# Patient Record
Sex: Female | Born: 1965 | Race: White | Hispanic: No | Marital: Married | Smoking: Former smoker
Health system: Southern US, Community
[De-identification: ages and names within clinical notes are randomized; demographics above are authoritative.]

## PROBLEM LIST (undated history)

## (undated) ENCOUNTER — Emergency Department: Admission: EM | Discharge status: Absent without leave | Payer: Self-pay

## (undated) DIAGNOSIS — E079 Disorder of thyroid, unspecified: Secondary | ICD-10-CM

## (undated) DIAGNOSIS — K222 Esophageal obstruction: Secondary | ICD-10-CM

## (undated) HISTORY — PX: ABDOMINAL HYSTERECTOMY: SHX81

## (undated) HISTORY — PX: LITHOTRIPSY: SUR834

## (undated) HISTORY — PX: APPENDECTOMY: SHX54

---

## 2008-04-13 ENCOUNTER — Encounter: Admission: RE | Admit: 2008-04-13 | Discharge: 2008-04-13 | Payer: Self-pay | Admitting: Unknown Physician Specialty

## 2008-04-14 ENCOUNTER — Encounter: Admission: RE | Admit: 2008-04-14 | Discharge: 2008-04-14 | Payer: Self-pay | Admitting: Unknown Physician Specialty

## 2008-08-29 ENCOUNTER — Encounter: Admission: RE | Admit: 2008-08-29 | Discharge: 2008-08-29 | Payer: Self-pay | Admitting: Obstetrics and Gynecology

## 2013-06-21 ENCOUNTER — Emergency Department
Admission: EM | Admit: 2013-06-21 | Discharge: 2013-06-21 | Disposition: A | Discharge status: Home / Self Care | Payer: BC Managed Care – PPO | Source: Home / Self Care | Attending: Emergency Medicine | Admitting: Emergency Medicine

## 2013-06-21 ENCOUNTER — Emergency Department (INDEPENDENT_AMBULATORY_CARE_PROVIDER_SITE_OTHER): Payer: BC Managed Care – PPO

## 2013-06-21 ENCOUNTER — Encounter: Payer: Self-pay | Admitting: Emergency Medicine

## 2013-06-21 DIAGNOSIS — R509 Fever, unspecified: Secondary | ICD-10-CM

## 2013-06-21 DIAGNOSIS — R05 Cough: Secondary | ICD-10-CM

## 2013-06-21 DIAGNOSIS — T7840XA Allergy, unspecified, initial encounter: Secondary | ICD-10-CM

## 2013-06-21 DIAGNOSIS — R062 Wheezing: Secondary | ICD-10-CM

## 2013-06-21 DIAGNOSIS — J209 Acute bronchitis, unspecified: Secondary | ICD-10-CM

## 2013-06-21 DIAGNOSIS — R059 Cough, unspecified: Secondary | ICD-10-CM

## 2013-06-21 HISTORY — DX: Disorder of thyroid, unspecified: E07.9

## 2013-06-21 MED ORDER — ALBUTEROL SULFATE (2.5 MG/3ML) 0.083% IN NEBU: 2.5000 mg | INHALATION_SOLUTION | RESPIRATORY_TRACT | Status: DC | PRN

## 2013-06-21 MED ORDER — AZITHROMYCIN 250 MG PO TABS: ORAL_TABLET | ORAL | Status: DC

## 2013-06-21 MED ORDER — METHYLPREDNISOLONE SODIUM SUCC 125 MG IJ SOLR
125.0000 mg | INTRAMUSCULAR | Status: AC
  Administered 2013-06-21: 125 mg via INTRAMUSCULAR

## 2013-06-21 MED ORDER — PREDNISONE 20 MG PO TABS: ORAL_TABLET | ORAL | Status: DC

## 2013-06-21 MED ORDER — IPRATROPIUM-ALBUTEROL 0.5-2.5 (3) MG/3ML IN SOLN
3.0000 mL | Freq: Once | RESPIRATORY_TRACT | Status: AC
Start: 2013-06-21 — End: 2013-06-21
  Administered 2013-06-21: 3 mL via RESPIRATORY_TRACT

## 2013-06-21 MED ORDER — PROMETHAZINE-DM 6.25-15 MG/5ML PO SYRP: ORAL_SOLUTION | ORAL | Status: DC

## 2013-06-21 NOTE — ED Notes (Signed)
Aja c/o productive cough that started in [place] on 06/04/13. She started taking Malarone on 06/01/13. Stopped Malarone on 2//15 She reports the air quality was poor in her area, very smoky due to volcanos and cooking with wood. She had a mild rash on her chest that lasted 2 days. Her cough is now dry without fever. SOB with exertion. No hx of lung disease.

## 2013-06-21 NOTE — ED Provider Notes (Addendum)
CSN: [id number]     Arrival date & time 06/21/13  4098 History   First MD Initiated Contact with Patient 06/21/13 1005     Chief Complaint  Patient presents with  . Cough     HPI Linda Drake c/o productive cough that started in [place] (on mission trip) on 06/04/13, after exposure to breathing [employer] there. She started taking Malarone (a week before leaving for [place]) on 05/28/13. Stopped Malarone on 06/18/13, about 4 days after returning to [place].  She reports the air quality was poor in her area there, very smoky due to volcanos and cooking with wood.  She had a mild itchy rash on her chest and legs while in [place] that lasted 2 days.--denies facial or lip swelling or dysphasia.   Her cough is now dry without fever. + Dyspnea, mild wheeze with exertion. No hx of lung disease.  She quit smoking 2005. She recalls a bout of bronchitis and wheezing in 2005 that resolved without sequelae. She may have had a low-grade fever for a few days, but denies high fever, chills or or sweats or rigors. Currently, denies any fever.  URI HISTORY  Timmy is a 48 y.o. female who complains of above symptoms. Has not been using any  over-the-counter treatment.  No chills/sweats + Low-grade Fever  No Nasal congestion No Discolored Post-nasal drainage No sinus pain/pressure No sore throat  +  Frequent, nonproductive cough Several days ago, had some cough productive of yellow-green sputum. Positive wheezing Positive chest congestion No hemoptysis History of mild shortness of breath with exertion No pleuritic pain  No itchy/red eyes No earache  No nausea No vomiting No abdominal pain No diarrhea  A week ago, had slightly pruritic rash anterior chest, lasted 2 days, then resolved.  +  Fatigue No myalgias. No arthralgias or joint swelling or joint symptoms  No headache   Past Medical History  Diagnosis Date  . Thyroid disease     radioactive iodine   Past Surgical History   Procedure Laterality Date  . Appendectomy    . Cesarean section     Family History  Problem Relation Age of Onset  . Hypertension Mother    History  Substance Use Topics  . Smoking status: Former Smoker -- 15 years    Types: Cigarettes    Quit date: 06/22/2003  . Smokeless tobacco: Never Used  . Alcohol Use: Yes   OB History   Grav Para Term Preterm Abortions TAB SAB Ect Mult Living                 Review of Systems  Constitutional: Positive for fatigue (mild). Negative for chills.  HENT: Negative for congestion, ear pain, facial swelling, rhinorrhea, sinus pressure, sneezing, sore throat and trouble swallowing.        Mild hoarseness at times  Respiratory: Positive for wheezing.   Cardiovascular: Negative for chest pain, palpitations and leg swelling.  Gastrointestinal: Negative.   Musculoskeletal: Negative for arthralgias and joint swelling.  Neurological: Negative for syncope.  All other systems reviewed and are negative.    LMP 05/31/2013  Allergies  Morphine and related  Home Medications   Current Outpatient Rx  Name  Route  Sig  Dispense  Refill  . levothyroxine (SYNTHROID, LEVOTHROID) 88 MCG tablet   Oral   Take 88 mcg by mouth daily before breakfast.         . albuterol (PROVENTIL) (2.5 MG/3ML) 0.083% nebulizer solution   Nebulization   Take 3  mLs (2.5 mg total) by nebulization every 4 (four) hours as needed for wheezing.   75 mL   0   . azithromycin (ZITHROMAX Z-PAK) 250 MG tablet      Take 2 tablets on day one, then 1 tablet daily on days 2 through 5   1 each   0   . predniSONE (DELTASONE) 20 MG tablet      Take 3 tablets daily with breakfast x3 days   9 tablet   0   . promethazine-dextromethorphan (PROMETHAZINE-DM) 6.25-15 MG/5ML syrup      5 ml po every 4-6 hours as needed for cough   118 mL   0    BP 117/85  Pulse 99  Temp(Src) 98.2 F (36.8 C) (Oral)  Resp 16  Ht 5\' 6"  (1.676 m)  Wt 140 lb (63.504 kg)  BMI 22.61 kg/m2   SpO2 100%  LMP 05/31/2013 Physical Exam  Nursing note and vitals reviewed. Constitutional: She is oriented to person, place, and time. She appears well-developed and well-nourished. No distress.  HENT:  Head: Normocephalic and atraumatic.  Right Ear: Tympanic membrane normal.  Left Ear: Tympanic membrane normal.  Nose: Nose normal.  Mouth/Throat: Oropharynx is clear and moist. No oropharyngeal exudate.  Eyes: Right eye exhibits no discharge. Left eye exhibits no discharge. No scleral icterus.  Neck: Neck supple.  Cardiovascular: Normal rate, regular rhythm and normal heart sounds.   Pulmonary/Chest: No respiratory distress. She has wheezes. She has rhonchi. She has no rales.  Lymphadenopathy:    She has no cervical adenopathy.  Neurological: She is alert and oriented to person, place, and time.  Skin: Skin is warm and dry.  Minimal, faint, slightly red, blanching rash, upper anterior chest. No red streaks.  No other rash noted    hacking cough noted.  ED Course  Procedures (including critical care time) Labs Review Labs Reviewed - No data to display Imaging Review Dg Chest 2 View  06/21/2013   CLINICAL DATA:  Cough and wheezing ; fever  EXAM: CHEST  2 VIEW  COMPARISON:  None.  FINDINGS: The lungs are clear. The heart size and pulmonary vascularity are normal. No adenopathy. No bone lesions.  IMPRESSION: No abnormality noted.   Electronically Signed   By: Bretta BangWilliam  Woodruff M.D.   On: 06/21/2013 10:47      MDM   Final diagnoses:  Bronchitis, acute, with bronchospasm  Allergic reaction    Most likely, has acute bronchitis with bronchospasm. Was likely triggered by poor air quality in [place].-- Although possible, less likely related to allergic reaction from the Malarone.  Treatment options discussed, as well as risks, benefits, alternatives. Patient voiced understanding and agreement with the following plans: DuoNeb nebulizer treatment given. Wheezing  improved. Solumedrol 125 mg IM stat Prednisone burst, 60 mg by mouth daily times three days Z Pak for bacterial coverage As nebulizer treatment was so effective in urgent care today,Albuterol solution nebules prescribed for home nebulizer, to use prn, Rx'd at her request. Promethazine DM cough syrup, use PRN cough.  Follow-up with your primary care doctor in 5-7 days if not improving, or sooner if symptoms become worse. Precautions discussed. Red flags discussed. Questions invited and answered. Patient voiced understanding and agreement.         Lajean Manesavid Massey, MD 06/22/13 1203  Lajean Manesavid Massey, MD 06/22/13 [phone]

## 2015-03-10 ENCOUNTER — Encounter: Payer: Self-pay | Admitting: Emergency Medicine

## 2015-03-10 ENCOUNTER — Emergency Department
Admission: EM | Admit: 2015-03-10 | Discharge: 2015-03-10 | Disposition: A | Discharge status: Home / Self Care | Payer: BLUE CROSS/BLUE SHIELD | Source: Home / Self Care | Attending: Family Medicine | Admitting: Family Medicine

## 2015-03-10 DIAGNOSIS — J0100 Acute maxillary sinusitis, unspecified: Secondary | ICD-10-CM | POA: Diagnosis not present

## 2015-03-10 DIAGNOSIS — J069 Acute upper respiratory infection, unspecified: Secondary | ICD-10-CM

## 2015-03-10 MED ORDER — SALINE SPRAY 0.65 % NA SOLN: 1.0000 | NASAL | Status: DC | PRN

## 2015-03-10 MED ORDER — AMOXICILLIN-POT CLAVULANATE 875-125 MG PO TABS: 1.0000 | ORAL_TABLET | Freq: Two times a day (BID) | ORAL | Status: DC

## 2015-03-10 MED ORDER — BENZONATATE 100 MG PO CAPS: 100.0000 mg | ORAL_CAPSULE | Freq: Three times a day (TID) | ORAL | Status: DC

## 2015-03-10 NOTE — ED Provider Notes (Signed)
CSN: [id number]     Arrival date & time 03/10/15  0919 History   First MD Initiated Contact with Patient 03/10/15 0920     Chief Complaint  Patient presents with  . Facial Pain  . Nasal Congestion  . Cough  . Headache  . Hoarse  . Fatigue   (Consider location/radiation/quality/duration/timing/severity/associated sxs/prior Treatment) HPI Pt is a 49yo female presenting to [facility] with c/o sudden onset, suddenly worsening sore throat, hoarse voice, nasal congestion with facial pain, frontal headache, fatigue, subjective fever and body aches that started 3 days ago.  Pt states symptoms started with severe sore throat that has started to improve but her facial pain is most concerning for patient.  She notes a family member has been in the hospital so she has been visiting him frequently but no sick contacts at home or recent travel. Denies n/v/d. She has been taking Advil with minimal relief.  She had her flu vaccine in late September of this year.  Past Medical History  Diagnosis Date  . Thyroid disease     radioactive iodine   Past Surgical History  Procedure Laterality Date  . Appendectomy    . Cesarean section     Family History  Problem Relation Age of Onset  . Hypertension Mother    Social History  Substance Use Topics  . Smoking status: Former Smoker -- 15 years    Types: Cigarettes    Quit date: 06/22/2003  . Smokeless tobacco: Never Used  . Alcohol Use: Yes   OB History    No data available     Review of Systems  Constitutional: Positive for fever, chills and fatigue.  HENT: Positive for congestion, rhinorrhea, sinus pressure, sneezing, sore throat and voice change. Negative for ear pain and trouble swallowing.   Respiratory: Positive for cough. Negative for chest tightness and shortness of breath.   Cardiovascular: Negative for chest pain and palpitations.  Gastrointestinal: Negative for nausea, vomiting, abdominal pain and diarrhea.  Musculoskeletal: Negative for  myalgias, back pain and arthralgias.  Skin: Negative for rash.  Neurological: Positive for headaches. Negative for dizziness and light-headedness.    Allergies  Morphine and related  Home Medications   Prior to Admission medications   Medication Sig Start Date End Date Taking? Authorizing Provider  albuterol (PROVENTIL) (2.5 MG/3ML) 0.083% nebulizer solution Take 3 mLs (2.5 mg total) by nebulization every 4 (four) hours as needed for wheezing. 06/21/13   Lajean Manes, MD  amoxicillin-clavulanate (AUGMENTIN) 875-125 MG tablet Take 1 tablet by mouth 2 (two) times daily. One po bid x 7 days 03/10/15   Junius Finner, PA-C  azithromycin (ZITHROMAX Z-PAK) 250 MG tablet Take 2 tablets on day one, then 1 tablet daily on days 2 through 5 06/21/13   Lajean Manes, MD  benzonatate (TESSALON) 100 MG capsule Take 1 capsule (100 mg total) by mouth every 8 (eight) hours. 03/10/15   Junius Finner, PA-C  levothyroxine (SYNTHROID, LEVOTHROID) 88 MCG tablet Take 88 mcg by mouth daily before breakfast.    Historical Provider, MD  predniSONE (DELTASONE) 20 MG tablet Take 3 tablets daily with breakfast x3 days 06/21/13   Lajean Manes, MD  promethazine-dextromethorphan (PROMETHAZINE-DM) 6.25-15 MG/5ML syrup 5 ml po every 4-6 hours as needed for cough 06/21/13   Lajean Manes, MD  sodium chloride (OCEAN) 0.65 % SOLN nasal spray Place 1 spray into both nostrils as needed. 03/10/15   Junius Finner, PA-C   Meds Ordered and Administered this Visit  Medications - No data  to display  BP 128/83 mmHg  Pulse 85  Temp(Src) 98.2 F (36.8 C) (Oral)  Resp 16  Ht 5\' 7"  (1.702 m)  Wt 155 lb (70.308 kg)  BMI 24.27 kg/m2  SpO2 99%  LMP 03/01/2015 No data found.   Physical Exam  Constitutional: She appears well-developed and well-nourished. No distress.  HENT:  Head: Normocephalic and atraumatic.  Right Ear: Hearing, tympanic membrane, external ear and ear canal normal.  Left Ear: Hearing, tympanic membrane, external ear  and ear canal normal.  Nose: Mucosal edema present. Right sinus exhibits maxillary sinus tenderness and frontal sinus tenderness. Left sinus exhibits maxillary sinus tenderness and frontal sinus tenderness.  Mouth/Throat: Uvula is midline and mucous membranes are normal. Posterior oropharyngeal erythema present. No oropharyngeal exudate, posterior oropharyngeal edema or tonsillar abscesses.  Eyes: Conjunctivae and EOM are normal. Pupils are equal, round, and reactive to light. No scleral icterus.  Neck: Normal range of motion. Neck supple.  Cardiovascular: Normal rate, regular rhythm and normal heart sounds.   Pulmonary/Chest: Effort normal and breath sounds normal. No respiratory distress. She has no wheezes. She has no rales. She exhibits no tenderness.  Abdominal: Soft. She exhibits no distension and no mass. There is no tenderness. There is no rebound and no guarding.  Musculoskeletal: Normal range of motion.  Lymphadenopathy:    She has no cervical adenopathy.  Neurological: She is alert.  Skin: Skin is warm and dry. She is not diaphoretic.  Nursing note and vitals reviewed.   ED Course  Procedures (including critical care time)  Labs Review Labs Reviewed - No data to display  Imaging Review No results found.    MDM   1. Acute maxillary sinusitis, recurrence not specified   2. Acute upper respiratory infection     Pt c/o facial pain and nasal congestion with body aches and cough.   Hx and exam c/w sinusitis Due to severe pain and other symptoms, will start pt on antibiotics. Rx: augmentin, saline nasal spray and tessalon  Advised pt to use acetaminophen and ibuprofen as needed for fever and pain. Encouraged rest and fluids.  F/u with PCP in 7-10 days if not improving, sooner if worsening.  Patient verbalized understanding and agreement with treatment plan.   Junius Finnerrin O'Malley, PA-C 03/10/15 1006

## 2015-03-10 NOTE — ED Notes (Signed)
Reports onset of congestion, cough and sense of fever 3 days ago; now facial pain extreme, with headache. Took ibuprofen early this morning. Had Flu shot late September/2016.

## 2015-03-10 NOTE — Discharge Instructions (Signed)
Please take antibiotics as prescribed and be sure to complete entire course even if you start to feel better to ensure infection does not come back. You may take 400-600mg  Ibuprofen (Motrin) every 6-8 hours for fever and pain  Alternate with Tylenol  You may take 500mg  Tylenol every 4-6 hours as needed for fever and pain  Follow-up with your primary care provider next week for recheck of symptoms if not improving.  Be sure to drink plenty of fluids and rest, at least 8hrs of sleep a night, preferably more while you are sick. You may also increase your Vitamin C intake by taking a daily vitamin and/or drinking orange juice to help your immune system and energy level. Return urgent care or go to closest ER if you cannot keep down fluids/signs of dehydration, fever not reducing with Tylenol, difficulty breathing/wheezing, stiff neck, worsening condition, or other concerns (see below)   [employer] Vaporizers Vaporizers may help relieve the symptoms of a cough and cold. They add moisture to the air, which helps mucus to become thinner and less sticky. This makes it easier to breathe and cough up secretions. Cool mist vaporizers do not cause serious burns like hot mist vaporizers, which may also be called steamers or humidifiers. Vaporizers have not been proven to help with colds. You should not use a vaporizer if you are allergic to mold. HOME CARE INSTRUCTIONS  Follow the package instructions for the vaporizer.  Do not use anything other than distilled water in the vaporizer.  Do not run the vaporizer all of the time. This can cause mold or bacteria to grow in the vaporizer.  Clean the vaporizer after each time it is used.  Clean and dry the vaporizer well before storing it.  Stop using the vaporizer if worsening respiratory symptoms develop.   This information is not intended to replace advice given to you by your health care provider. Make sure you discuss any questions you have with your  health care provider.   Document Released: 01/24/2004 Document Revised: 05/03/2013 Document Reviewed: 09/15/2012 Elsevier Interactive Patient Education [employer]2016 [employer]vier Inc.

## 2015-03-13 ENCOUNTER — Telehealth: Payer: Self-pay | Admitting: *Deleted

## 2015-03-13 MED ORDER — PREDNISONE 10 MG PO TABS: 40.0000 mg | ORAL_TABLET | Freq: Every day | ORAL | Status: DC

## 2015-04-10 ENCOUNTER — Other Ambulatory Visit: Payer: Self-pay | Admitting: Obstetrics and Gynecology

## 2015-04-10 DIAGNOSIS — N644 Mastodynia: Secondary | ICD-10-CM

## 2015-04-16 ENCOUNTER — Ambulatory Visit
Admission: RE | Admit: 2015-04-16 | Discharge: 2015-04-16 | Disposition: A | Discharge status: Home / Self Care | Payer: BLUE CROSS/BLUE SHIELD | Source: Ambulatory Visit | Attending: Obstetrics and Gynecology | Admitting: Obstetrics and Gynecology

## 2015-04-16 ENCOUNTER — Other Ambulatory Visit: Payer: BLUE CROSS/BLUE SHIELD

## 2015-04-16 DIAGNOSIS — N644 Mastodynia: Secondary | ICD-10-CM

## 2015-08-13 DIAGNOSIS — R10812 Left upper quadrant abdominal tenderness: Secondary | ICD-10-CM | POA: Diagnosis not present

## 2015-08-13 DIAGNOSIS — J029 Acute pharyngitis, unspecified: Secondary | ICD-10-CM | POA: Diagnosis not present

## 2015-08-13 DIAGNOSIS — R591 Generalized enlarged lymph nodes: Secondary | ICD-10-CM | POA: Diagnosis not present

## 2015-08-13 DIAGNOSIS — R509 Fever, unspecified: Secondary | ICD-10-CM | POA: Diagnosis not present

## 2015-10-17 DIAGNOSIS — K649 Unspecified hemorrhoids: Secondary | ICD-10-CM | POA: Diagnosis not present

## 2015-11-01 DIAGNOSIS — M9904 Segmental and somatic dysfunction of sacral region: Secondary | ICD-10-CM | POA: Diagnosis not present

## 2015-11-01 DIAGNOSIS — M5411 Radiculopathy, occipito-atlanto-axial region: Secondary | ICD-10-CM | POA: Diagnosis not present

## 2015-11-05 DIAGNOSIS — S99922A Unspecified injury of left foot, initial encounter: Secondary | ICD-10-CM | POA: Diagnosis not present

## 2015-11-05 DIAGNOSIS — S93505A Unspecified sprain of left lesser toe(s), initial encounter: Secondary | ICD-10-CM | POA: Diagnosis not present

## 2015-11-26 DIAGNOSIS — J9801 Acute bronchospasm: Secondary | ICD-10-CM | POA: Diagnosis not present

## 2015-11-26 DIAGNOSIS — J069 Acute upper respiratory infection, unspecified: Secondary | ICD-10-CM | POA: Diagnosis not present

## 2015-12-17 DIAGNOSIS — M5411 Radiculopathy, occipito-atlanto-axial region: Secondary | ICD-10-CM | POA: Diagnosis not present

## 2015-12-17 DIAGNOSIS — M9904 Segmental and somatic dysfunction of sacral region: Secondary | ICD-10-CM | POA: Diagnosis not present

## 2015-12-19 DIAGNOSIS — M9904 Segmental and somatic dysfunction of sacral region: Secondary | ICD-10-CM | POA: Diagnosis not present

## 2015-12-19 DIAGNOSIS — M5411 Radiculopathy, occipito-atlanto-axial region: Secondary | ICD-10-CM | POA: Diagnosis not present

## 2016-01-28 DIAGNOSIS — M9904 Segmental and somatic dysfunction of sacral region: Secondary | ICD-10-CM | POA: Diagnosis not present

## 2016-01-28 DIAGNOSIS — M5411 Radiculopathy, occipito-atlanto-axial region: Secondary | ICD-10-CM | POA: Diagnosis not present

## 2016-02-18 DIAGNOSIS — M7742 Metatarsalgia, left foot: Secondary | ICD-10-CM | POA: Diagnosis not present

## 2016-02-18 DIAGNOSIS — M7752 Other enthesopathy of left foot: Secondary | ICD-10-CM | POA: Diagnosis not present

## 2016-03-10 DIAGNOSIS — E890 Postprocedural hypothyroidism: Secondary | ICD-10-CM | POA: Diagnosis not present

## 2016-03-10 DIAGNOSIS — M5411 Radiculopathy, occipito-atlanto-axial region: Secondary | ICD-10-CM | POA: Diagnosis not present

## 2016-03-10 DIAGNOSIS — M9904 Segmental and somatic dysfunction of sacral region: Secondary | ICD-10-CM | POA: Diagnosis not present

## 2016-03-12 DIAGNOSIS — E890 Postprocedural hypothyroidism: Secondary | ICD-10-CM | POA: Diagnosis not present

## 2016-03-31 DIAGNOSIS — M5411 Radiculopathy, occipito-atlanto-axial region: Secondary | ICD-10-CM | POA: Diagnosis not present

## 2016-03-31 DIAGNOSIS — M9904 Segmental and somatic dysfunction of sacral region: Secondary | ICD-10-CM | POA: Diagnosis not present

## 2016-04-05 ENCOUNTER — Emergency Department
Admission: EM | Admit: 2016-04-05 | Discharge: 2016-04-05 | Disposition: A | Discharge status: Home / Self Care | Payer: BLUE CROSS/BLUE SHIELD | Source: Home / Self Care | Attending: Family Medicine | Admitting: Family Medicine

## 2016-04-05 ENCOUNTER — Encounter: Payer: Self-pay | Admitting: Emergency Medicine

## 2016-04-05 DIAGNOSIS — N3000 Acute cystitis without hematuria: Secondary | ICD-10-CM | POA: Diagnosis not present

## 2016-04-05 DIAGNOSIS — R300 Dysuria: Secondary | ICD-10-CM | POA: Diagnosis not present

## 2016-04-05 LAB — POCT URINALYSIS DIP (MANUAL ENTRY)
Bilirubin, UA: NEGATIVE
GLUCOSE UA: NEGATIVE
Ketones, POC UA: NEGATIVE
Nitrite, UA: NEGATIVE
PH UA: 7
SPEC GRAV UA: 1.015
UROBILINOGEN UA: 0.2

## 2016-04-05 MED ORDER — PHENAZOPYRIDINE HCL 200 MG PO TABS: 200.0000 mg | ORAL_TABLET | Freq: Three times a day (TID) | ORAL | 0 refills | Status: DC

## 2016-04-05 MED ORDER — NITROFURANTOIN MONOHYD MACRO 100 MG PO CAPS: 100.0000 mg | ORAL_CAPSULE | Freq: Two times a day (BID) | ORAL | 0 refills | Status: DC

## 2016-04-05 NOTE — Discharge Instructions (Signed)
Continue increased fluid intake. ° °If symptoms become significantly worse during the night or over the weekend, proceed to the local emergency room.  °

## 2016-04-05 NOTE — ED Provider Notes (Signed)
[facility]    CSN: [id number] Arrival date & time: 04/05/16  0902     History   Chief Complaint Chief Complaint  Patient presents with  . Urinary Tract Infection    HPI Linda Drake is a 50 y.o. female Continue increased fluid intake.  If symptoms become significantly worse during the night or over the weekend, proceed to the local emergency room. at.   Patient complains of 3 day history of dysuria, frequency, and nocturia.   The history is provided by the patient.  Urinary Tract Infection  Pain quality:  Burning Pain severity:  Mild Onset quality:  Gradual Duration:  3 days Timing:  Constant Progression:  Unchanged Chronicity:  New Recent urinary tract infections: no   Relieved by:  Nothing Worsened by:  Nothing Ineffective treatments:  Cranberry juice Urinary symptoms: frequent urination and hesitancy   Urinary symptoms: no discolored urine, no foul-smelling urine, no hematuria and no bladder incontinence   Associated symptoms: no abdominal pain, no fever, no flank pain, no genital lesions, no nausea, no vaginal discharge and no vomiting   Risk factors: no recurrent urinary tract infections     Past Medical History:  Diagnosis Date  . Thyroid disease    radioactive iodine    There are no active problems to display for this patient.   Past Surgical History:  Procedure Laterality Date  . ABDOMINAL HYSTERECTOMY    . APPENDECTOMY    . CESAREAN SECTION      OB History    No data available       Home Medications    Prior to Admission medications   Medication Sig Start Date End Date Taking? Authorizing Provider  Ascorbic Acid (VITAMIN C PO) Take by mouth.   Yes Historical Provider, MD  Cholecalciferol (VITAMIN D3 PO) Take by mouth.   Yes Historical Provider, MD  levothyroxine (SYNTHROID, LEVOTHROID) 88 MCG tablet Take 88 mcg by mouth daily before breakfast.    Historical Provider, MD  nitrofurantoin, macrocrystal-monohydrate, (MACROBID)  100 MG capsule Take 1 capsule (100 mg total) by mouth 2 (two) times daily. Take with food. 04/05/16   Lattie HawStephen A Kaleiah Kutzer, MD  phenazopyridine (PYRIDIUM) 200 MG tablet Take 1 tablet (200 mg total) by mouth 3 (three) times daily. Take after meals. 04/05/16   Lattie HawStephen A Vertie Dibbern, MD    Family History Family History  Problem Relation Age of Onset  . Hypertension Mother     Social History Social History  Substance Use Topics  . Smoking status: Former Smoker    Years: 15.00    Types: Cigarettes    Quit date: 06/22/2003  . Smokeless tobacco: Never Used  . Alcohol use Yes     Allergies   Morphine and related   Review of Systems Review of Systems  Constitutional: Negative for fever.  Gastrointestinal: Negative for abdominal pain, nausea and vomiting.  Genitourinary: Positive for frequency, genital sores and urgency. Negative for decreased urine volume, flank pain, hematuria, pelvic pain, vaginal bleeding and vaginal discharge.  All other systems reviewed and are negative.    Physical Exam Triage Vital Signs ED Triage Vitals  Enc Vitals Group     BP 04/05/16 0925 125/94     Pulse Rate 04/05/16 0925 85     Resp --      Temp 04/05/16 0925 97.9 F (36.6 C)     Temp Source 04/05/16 0925 Oral     SpO2 04/05/16 0925 99 %     Weight 04/05/16 0925  162 lb (73.5 kg)     Height 04/05/16 0925 5\' 7"  (1.702 m)     Head Circumference --      Peak Flow --      Pain Score 04/05/16 0929 0     Pain Loc --      Pain Edu? --      Excl. in GC? --    No data found.   Updated Vital Signs BP 125/94 (BP Location: Left Arm)   Pulse 85   Temp 97.9 F (36.6 C) (Oral)   Ht 5\' 7"  (1.702 m)   Wt 162 lb (73.5 kg)   SpO2 99%   BMI 25.37 kg/m   Visual Acuity Right Eye Distance:   Left Eye Distance:   Bilateral Distance:    Right Eye Near:   Left Eye Near:    Bilateral Near:     Physical Exam Nursing notes and Vital Signs reviewed. Appearance:  Patient appears stated age, and in no acute  distress.    Eyes:  Pupils are equal, round, and reactive to light and accomodation.  Extraocular movement is intact.  Conjunctivae are not inflamed   Pharynx:  Normal; moist mucous membranes  Neck:  Supple.  No adenopathy Lungs:  Clear to auscultation.  Breath sounds are equal.  Moving air well. Heart:  Regular rate and rhythm without murmurs, rubs, or gallops.  Abdomen:  Nontender without masses or hepatosplenomegaly.  Bowel sounds are present.  No CVA or flank tenderness.  Extremities:  No edema.  Skin:  No rash present.     UC Treatments / Results  Labs (all labs ordered are listed, but only abnormal results are displayed) Labs Reviewed  POCT URINALYSIS DIP (MANUAL ENTRY) - Abnormal; Notable for the following:       Result Value   Color, UA light yellow (*)    Blood, UA moderate (*)    Protein Ur, POC =30 (*)    Leukocytes, UA moderate (2+) (*)    All other components within normal limits  URINE CULTURE    EKG  EKG Interpretation None       Radiology No results found.  Procedures Procedures (including critical care time)  Medications Ordered in UC Medications - No data to display   Initial Impression / Assessment and Plan / UC Course  I have reviewed the triage vital signs and the nursing notes.  Pertinent labs & imaging results that were available during my care of the patient were reviewed by me and considered in my medical decision making (see chart for details).  Clinical Course   Urine culture pending. Begin Macrobid 100mg  BID for one week. Rx for Pyridium. Increase fluid intake. If symptoms become significantly worse during the night or over the weekend, proceed to the local emergency room.  Followup with Family Doctor if not improved in one week.      Final Clinical Impressions(s) / UC Diagnoses   Final diagnoses:  Acute cystitis without hematuria    New Prescriptions New Prescriptions   NITROFURANTOIN, MACROCRYSTAL-MONOHYDRATE, (MACROBID)  100 MG CAPSULE    Take 1 capsule (100 mg total) by mouth 2 (two) times daily. Take with food.   PHENAZOPYRIDINE (PYRIDIUM) 200 MG TABLET    Take 1 tablet (200 mg total) by mouth 3 (three) times daily. Take after meals.     Lattie HawStephen A Thaxton Pelley, MD 04/05/16 [phone]

## 2016-04-05 NOTE — ED Triage Notes (Signed)
Pt c/o dysuria and pressure w/urination x 3 days.

## 2016-04-07 ENCOUNTER — Telehealth: Payer: Self-pay | Admitting: Emergency Medicine

## 2016-04-07 LAB — URINE CULTURE

## 2016-04-07 NOTE — Telephone Encounter (Signed)
Inquired about patient's status; encourage them to call with questions/concerns.  

## 2016-04-17 DIAGNOSIS — M9904 Segmental and somatic dysfunction of sacral region: Secondary | ICD-10-CM | POA: Diagnosis not present

## 2016-04-17 DIAGNOSIS — M5411 Radiculopathy, occipito-atlanto-axial region: Secondary | ICD-10-CM | POA: Diagnosis not present

## 2016-05-16 DIAGNOSIS — J9801 Acute bronchospasm: Secondary | ICD-10-CM | POA: Diagnosis not present

## 2016-05-16 DIAGNOSIS — J069 Acute upper respiratory infection, unspecified: Secondary | ICD-10-CM | POA: Diagnosis not present

## 2016-06-26 DIAGNOSIS — Z13220 Encounter for screening for lipoid disorders: Secondary | ICD-10-CM | POA: Diagnosis not present

## 2016-06-26 DIAGNOSIS — Z1231 Encounter for screening mammogram for malignant neoplasm of breast: Secondary | ICD-10-CM | POA: Diagnosis not present

## 2016-06-26 DIAGNOSIS — Z124 Encounter for screening for malignant neoplasm of cervix: Secondary | ICD-10-CM | POA: Diagnosis not present

## 2016-06-26 DIAGNOSIS — Z130 Encounter for screening for diseases of the blood and blood-forming organs and certain disorders involving the immune mechanism: Secondary | ICD-10-CM | POA: Diagnosis not present

## 2016-06-26 DIAGNOSIS — Z01419 Encounter for gynecological examination (general) (routine) without abnormal findings: Secondary | ICD-10-CM | POA: Diagnosis not present

## 2016-06-26 DIAGNOSIS — Z1329 Encounter for screening for other suspected endocrine disorder: Secondary | ICD-10-CM | POA: Diagnosis not present

## 2016-06-26 DIAGNOSIS — Z6827 Body mass index (BMI) 27.0-27.9, adult: Secondary | ICD-10-CM | POA: Diagnosis not present

## 2016-08-04 DIAGNOSIS — M7742 Metatarsalgia, left foot: Secondary | ICD-10-CM | POA: Diagnosis not present

## 2016-08-04 DIAGNOSIS — M79672 Pain in left foot: Secondary | ICD-10-CM | POA: Diagnosis not present

## 2016-08-04 DIAGNOSIS — M7752 Other enthesopathy of left foot: Secondary | ICD-10-CM | POA: Diagnosis not present

## 2016-08-19 DIAGNOSIS — D225 Melanocytic nevi of trunk: Secondary | ICD-10-CM | POA: Diagnosis not present

## 2016-08-19 DIAGNOSIS — L859 Epidermal thickening, unspecified: Secondary | ICD-10-CM | POA: Diagnosis not present

## 2016-08-19 DIAGNOSIS — D485 Neoplasm of uncertain behavior of skin: Secondary | ICD-10-CM | POA: Diagnosis not present

## 2016-08-19 DIAGNOSIS — L814 Other melanin hyperpigmentation: Secondary | ICD-10-CM | POA: Diagnosis not present

## 2016-09-01 DIAGNOSIS — Z0000 Encounter for general adult medical examination without abnormal findings: Secondary | ICD-10-CM | POA: Diagnosis not present

## 2016-09-04 DIAGNOSIS — M7752 Other enthesopathy of left foot: Secondary | ICD-10-CM | POA: Diagnosis not present

## 2016-09-04 DIAGNOSIS — M7742 Metatarsalgia, left foot: Secondary | ICD-10-CM | POA: Diagnosis not present

## 2016-10-01 DIAGNOSIS — M5411 Radiculopathy, occipito-atlanto-axial region: Secondary | ICD-10-CM | POA: Diagnosis not present

## 2016-10-01 DIAGNOSIS — M9904 Segmental and somatic dysfunction of sacral region: Secondary | ICD-10-CM | POA: Diagnosis not present

## 2017-01-13 DIAGNOSIS — M7742 Metatarsalgia, left foot: Secondary | ICD-10-CM | POA: Diagnosis not present

## 2017-01-13 DIAGNOSIS — M7752 Other enthesopathy of left foot: Secondary | ICD-10-CM | POA: Diagnosis not present

## 2017-01-15 DIAGNOSIS — R937 Abnormal findings on diagnostic imaging of other parts of musculoskeletal system: Secondary | ICD-10-CM | POA: Diagnosis not present

## 2017-01-15 DIAGNOSIS — M25475 Effusion, left foot: Secondary | ICD-10-CM | POA: Diagnosis not present

## 2017-01-15 DIAGNOSIS — S99922A Unspecified injury of left foot, initial encounter: Secondary | ICD-10-CM | POA: Diagnosis not present

## 2017-01-20 DIAGNOSIS — M7742 Metatarsalgia, left foot: Secondary | ICD-10-CM | POA: Diagnosis not present

## 2017-01-20 DIAGNOSIS — M7752 Other enthesopathy of left foot: Secondary | ICD-10-CM | POA: Diagnosis not present

## 2017-03-12 DIAGNOSIS — E890 Postprocedural hypothyroidism: Secondary | ICD-10-CM | POA: Diagnosis not present

## 2017-03-12 DIAGNOSIS — E559 Vitamin D deficiency, unspecified: Secondary | ICD-10-CM | POA: Diagnosis not present

## 2017-03-17 DIAGNOSIS — E559 Vitamin D deficiency, unspecified: Secondary | ICD-10-CM | POA: Diagnosis not present

## 2017-03-17 DIAGNOSIS — E890 Postprocedural hypothyroidism: Secondary | ICD-10-CM | POA: Diagnosis not present

## 2017-04-15 DIAGNOSIS — M24571 Contracture, right ankle: Secondary | ICD-10-CM | POA: Diagnosis not present

## 2017-04-15 DIAGNOSIS — M7742 Metatarsalgia, left foot: Secondary | ICD-10-CM | POA: Diagnosis not present

## 2017-04-15 DIAGNOSIS — M7752 Other enthesopathy of left foot: Secondary | ICD-10-CM | POA: Diagnosis not present

## 2017-04-15 DIAGNOSIS — M24572 Contracture, left ankle: Secondary | ICD-10-CM | POA: Diagnosis not present

## 2017-04-22 DIAGNOSIS — J9801 Acute bronchospasm: Secondary | ICD-10-CM | POA: Diagnosis not present

## 2017-04-22 DIAGNOSIS — J069 Acute upper respiratory infection, unspecified: Secondary | ICD-10-CM | POA: Diagnosis not present

## 2017-06-09 DIAGNOSIS — J019 Acute sinusitis, unspecified: Secondary | ICD-10-CM | POA: Diagnosis not present

## 2017-07-23 DIAGNOSIS — Z124 Encounter for screening for malignant neoplasm of cervix: Secondary | ICD-10-CM | POA: Diagnosis not present

## 2017-07-23 DIAGNOSIS — Z6826 Body mass index (BMI) 26.0-26.9, adult: Secondary | ICD-10-CM | POA: Diagnosis not present

## 2017-07-23 DIAGNOSIS — Z1231 Encounter for screening mammogram for malignant neoplasm of breast: Secondary | ICD-10-CM | POA: Diagnosis not present

## 2017-07-23 DIAGNOSIS — Z01419 Encounter for gynecological examination (general) (routine) without abnormal findings: Secondary | ICD-10-CM | POA: Diagnosis not present

## 2017-08-10 DIAGNOSIS — Z800 Family history of malignant neoplasm of digestive organs: Secondary | ICD-10-CM | POA: Diagnosis not present

## 2017-08-10 DIAGNOSIS — K625 Hemorrhage of anus and rectum: Secondary | ICD-10-CM | POA: Diagnosis not present

## 2017-08-25 DIAGNOSIS — K921 Melena: Secondary | ICD-10-CM | POA: Diagnosis not present

## 2017-08-25 DIAGNOSIS — K635 Polyp of colon: Secondary | ICD-10-CM | POA: Diagnosis not present

## 2017-08-25 DIAGNOSIS — D127 Benign neoplasm of rectosigmoid junction: Secondary | ICD-10-CM | POA: Diagnosis not present

## 2017-08-25 DIAGNOSIS — K625 Hemorrhage of anus and rectum: Secondary | ICD-10-CM | POA: Diagnosis not present

## 2017-08-25 DIAGNOSIS — K648 Other hemorrhoids: Secondary | ICD-10-CM | POA: Diagnosis not present

## 2017-09-02 DIAGNOSIS — Z0000 Encounter for general adult medical examination without abnormal findings: Secondary | ICD-10-CM | POA: Diagnosis not present

## 2017-09-11 DIAGNOSIS — R945 Abnormal results of liver function studies: Secondary | ICD-10-CM | POA: Diagnosis not present

## 2017-10-01 DIAGNOSIS — L299 Pruritus, unspecified: Secondary | ICD-10-CM | POA: Diagnosis not present

## 2017-10-01 DIAGNOSIS — L309 Dermatitis, unspecified: Secondary | ICD-10-CM | POA: Diagnosis not present

## 2017-10-01 DIAGNOSIS — L821 Other seborrheic keratosis: Secondary | ICD-10-CM | POA: Diagnosis not present

## 2017-10-20 DIAGNOSIS — L299 Pruritus, unspecified: Secondary | ICD-10-CM | POA: Diagnosis not present

## 2017-10-20 DIAGNOSIS — L309 Dermatitis, unspecified: Secondary | ICD-10-CM | POA: Diagnosis not present

## 2018-03-10 DIAGNOSIS — K625 Hemorrhage of anus and rectum: Secondary | ICD-10-CM | POA: Diagnosis not present

## 2018-03-10 DIAGNOSIS — K642 Third degree hemorrhoids: Secondary | ICD-10-CM | POA: Diagnosis not present

## 2018-03-17 DIAGNOSIS — E890 Postprocedural hypothyroidism: Secondary | ICD-10-CM | POA: Diagnosis not present

## 2018-03-22 DIAGNOSIS — E890 Postprocedural hypothyroidism: Secondary | ICD-10-CM | POA: Diagnosis not present

## 2018-03-23 DIAGNOSIS — K642 Third degree hemorrhoids: Secondary | ICD-10-CM | POA: Diagnosis not present

## 2018-04-22 DIAGNOSIS — K625 Hemorrhage of anus and rectum: Secondary | ICD-10-CM | POA: Diagnosis not present

## 2018-04-22 DIAGNOSIS — K642 Third degree hemorrhoids: Secondary | ICD-10-CM | POA: Diagnosis not present

## 2018-05-19 DIAGNOSIS — K642 Third degree hemorrhoids: Secondary | ICD-10-CM | POA: Diagnosis not present

## 2018-06-15 DIAGNOSIS — K625 Hemorrhage of anus and rectum: Secondary | ICD-10-CM | POA: Diagnosis not present

## 2018-06-15 DIAGNOSIS — K648 Other hemorrhoids: Secondary | ICD-10-CM | POA: Diagnosis not present

## 2018-07-22 DIAGNOSIS — K641 Second degree hemorrhoids: Secondary | ICD-10-CM | POA: Diagnosis not present

## 2018-09-06 DIAGNOSIS — E559 Vitamin D deficiency, unspecified: Secondary | ICD-10-CM | POA: Diagnosis not present

## 2018-09-06 DIAGNOSIS — Z0000 Encounter for general adult medical examination without abnormal findings: Secondary | ICD-10-CM | POA: Diagnosis not present

## 2018-09-06 DIAGNOSIS — J309 Allergic rhinitis, unspecified: Secondary | ICD-10-CM | POA: Diagnosis not present

## 2018-09-06 DIAGNOSIS — L309 Dermatitis, unspecified: Secondary | ICD-10-CM | POA: Diagnosis not present

## 2018-09-20 DIAGNOSIS — K641 Second degree hemorrhoids: Secondary | ICD-10-CM | POA: Diagnosis not present

## 2018-09-22 DIAGNOSIS — Z87891 Personal history of nicotine dependence: Secondary | ICD-10-CM | POA: Diagnosis not present

## 2018-09-22 DIAGNOSIS — R400 Somnolence: Secondary | ICD-10-CM | POA: Diagnosis not present

## 2018-09-22 DIAGNOSIS — Z79899 Other long term (current) drug therapy: Secondary | ICD-10-CM | POA: Diagnosis not present

## 2018-09-22 DIAGNOSIS — D751 Secondary polycythemia: Secondary | ICD-10-CM | POA: Diagnosis not present

## 2018-10-25 DIAGNOSIS — M545 Low back pain: Secondary | ICD-10-CM | POA: Diagnosis not present

## 2018-10-25 DIAGNOSIS — R509 Fever, unspecified: Secondary | ICD-10-CM | POA: Diagnosis not present

## 2018-11-04 DIAGNOSIS — G4710 Hypersomnia, unspecified: Secondary | ICD-10-CM | POA: Diagnosis not present

## 2018-11-04 DIAGNOSIS — R0683 Snoring: Secondary | ICD-10-CM | POA: Diagnosis not present

## 2018-11-11 ENCOUNTER — Other Ambulatory Visit: Payer: Self-pay

## 2018-11-11 ENCOUNTER — Emergency Department
Admission: EM | Admit: 2018-11-11 | Discharge: 2018-11-11 | Disposition: A | Discharge status: Home / Self Care | Payer: BC Managed Care – PPO | Source: Home / Self Care

## 2018-11-11 DIAGNOSIS — L089 Local infection of the skin and subcutaneous tissue, unspecified: Secondary | ICD-10-CM

## 2018-11-11 DIAGNOSIS — Z23 Encounter for immunization: Secondary | ICD-10-CM | POA: Diagnosis not present

## 2018-11-11 DIAGNOSIS — M25571 Pain in right ankle and joints of right foot: Secondary | ICD-10-CM

## 2018-11-11 DIAGNOSIS — T148XXA Other injury of unspecified body region, initial encounter: Secondary | ICD-10-CM

## 2018-11-11 MED ORDER — TETANUS-DIPHTH-ACELL PERTUSSIS 5-2.5-18.5 LF-MCG/0.5 IM SUSP
0.5000 mL | Freq: Once | INTRAMUSCULAR | Status: AC
  Administered 2018-11-11: 0.5 mL via INTRAMUSCULAR

## 2018-11-11 MED ORDER — SULFAMETHOXAZOLE-TRIMETHOPRIM 800-160 MG PO TABS: 1.0000 | ORAL_TABLET | Freq: Two times a day (BID) | ORAL | 0 refills | Status: AC

## 2018-11-11 NOTE — ED Triage Notes (Signed)
Pt was walking with a rake Tuesday afternoon, and fell over a stump, and the rake cut into her left ankle.  Redness, heat, and a small amount of drainage noted.

## 2018-11-11 NOTE — ED Provider Notes (Addendum)
**Note Linda-Identified via Obfuscation** [facility]    CSN: [id number] Arrival date & time: 11/11/18  1537     History   Chief Complaint Chief Complaint  Patient presents with  . Ankle Injury    HPI Linda Drake is a 53 y.o. female.   HPI  Linda Drake is a 53 y.o. female presenting to UC with c/o Right lateral ankle pain, mild swelling, redness and warmth around a puncture wound that occurred 2 days ago after she stepped on a rake that punctured into her skin, causing bleeding at the time. She did try to rinse and flush the wound. She has been applying Neosporin but pain and redness is worsening along with a small amount of drainage and low-grade temp.  Denies n/v/d. Pt unsure of her last tetanus, could have been at least 6 years ago but may have been longer.   Past Medical History:  Diagnosis Date  . Thyroid disease    radioactive iodine    There are no active problems to display for this patient.   Past Surgical History:  Procedure Laterality Date  . APPENDECTOMY    . CESAREAN SECTION      OB History   No obstetric history on file.      Home Medications    Prior to Admission medications   Medication Sig Start Date End Date Taking? Authorizing Provider  Ascorbic Acid (VITAMIN C PO) Take by mouth.    [provider]  Cholecalciferol (VITAMIN D3 PO) Take by mouth.    [provider]  levothyroxine (SYNTHROID, LEVOTHROID) 88 MCG tablet Take 88 mcg by mouth daily before breakfast.    [provider]  nitrofurantoin, macrocrystal-monohydrate, (MACROBID) 100 MG capsule Take 1 capsule (100 mg total) by mouth 2 (two) times daily. Take with food. 04/05/16   Lattie HawBeese, Stephen A, MD  phenazopyridine (PYRIDIUM) 200 MG tablet Take 1 tablet (200 mg total) by mouth 3 (three) times daily. Take after meals. 04/05/16   Lattie HawBeese, Stephen A, MD  sulfamethoxazole-trimethoprim (BACTRIM DS) 800-160 MG tablet Take 1 tablet by mouth 2 (two) times daily for 7 days. 11/11/18 11/18/18  Lurene ShadowPhelps, Syd Newsome O,  PA-C    Family History Family History  Problem Relation Age of Onset  . Hypertension Mother     Social History Social History   Tobacco Use  . Smoking status: Former Smoker    Years: 15.00    Types: Cigarettes    Quit date: 06/22/2003    Years since quitting: 15.4  . Smokeless tobacco: Never Used  Substance Use Topics  . Alcohol use: Yes  . Drug use: No     Allergies   Morphine and related   Review of Systems Review of Systems  Constitutional: Positive for fever. Negative for chills.  Musculoskeletal: Positive for arthralgias. Negative for joint swelling and myalgias.  Skin: Positive for color change and wound.     Physical Exam Triage Vital Signs ED Triage Vitals  Enc Vitals Group     BP 11/11/18 1559 128/88     Pulse Rate 11/11/18 1559 96     Resp 11/11/18 1559 20     Temp 11/11/18 1559 98.9 F (37.2 C)     Temp Source 11/11/18 1559 Oral     SpO2 11/11/18 1559 98 %     Weight 11/11/18 1600 145 lb (65.8 kg)     Height 11/11/18 1600 5\' 6"  (1.676 m)     Head Circumference --      Peak Flow --  Pain Score 11/11/18 1600 1     Pain Loc --      Pain Edu? --      Excl. in [place]? --    No data found.  Updated Vital Signs BP 128/88 (BP Location: Right Arm)   Pulse 96   Temp 98.9 F (37.2 C) (Oral)   Resp 20   Ht 5\' 6"  (1.676 m)   Wt 145 lb (65.8 kg)   LMP 11/07/2018   SpO2 98%   BMI 23.40 kg/m     Physical Exam Vitals signs and nursing note reviewed.  Constitutional:      Appearance: She is well-developed.  HENT:     Head: Normocephalic and atraumatic.  Neck:     Musculoskeletal: Normal range of motion.  Cardiovascular:     Rate and Rhythm: Normal rate.     Pulses:          Dorsalis pedis pulses are 2+ on the right side.       Posterior tibial pulses are 2+ on the right side.  Pulmonary:     Effort: Pulmonary effort is normal.  Musculoskeletal: Normal range of motion.        General: Swelling and tenderness present.       Feet:      Comments: Right lateral ankle: minimal edema, tenderness. Full ROM. No tenderness to achilles tendon. Calf is soft, non-tender. No foot edema or tenderness.  Skin:    General: Skin is warm and dry.     Capillary Refill: Capillary refill takes less than 2 seconds.     Findings: Erythema present.  Neurological:     Mental Status: She is alert and oriented to person, place, and time.  Psychiatric:        Behavior: Behavior normal.      UC Treatments / Results  Labs (all labs ordered are listed, but only abnormal results are displayed) Labs Reviewed - No data to display  EKG   Radiology No results found.  Procedures Procedures (including critical care time)  Medications Ordered in UC Medications  Tdap (BOOSTRIX) injection 0.5 mL (0.5 mLs Intramuscular Given 11/11/18 1639)    Initial Impression / Assessment and Plan / UC Course  I have reviewed the triage vital signs and the nursing notes.  Pertinent labs & imaging results that were available during my care of the patient were reviewed by me and considered in my medical decision making (see chart for details).    Tetanus updated today. Hx and exam c/w infected puncture wound of Right ankle No evidence of septic joint at this time. No evidence of achilles tendon involvement at this time. Will start pt on Bactrim Home care info provided. Encouraged f/u in 2-3 days if not improving, sooner if worsening.  Final Clinical Impressions(s) / UC Diagnoses   Final diagnoses:  Infected puncture wound  Acute right ankle pain     Discharge Instructions      Keep wound clean with warm water and mild soap. You may want to keep wound covered with a bandage to keep clean and protected given the location of the wound.  You may take 500mg  acetaminophen every 4-6 hours or in combination with ibuprofen 400-600mg  every 6-8 hours as needed for pain, inflammation, and fever.  Please follow up in 2-3 days if not improving, sooner if  worsening.  Go to the emergency department if symptoms significantly worsening over night or this weekend.     ED Prescriptions    Medication  Sig Dispense Auth. Provider   sulfamethoxazole-trimethoprim (BACTRIM DS) 800-160 MG tablet Take 1 tablet by mouth 2 (two) times daily for 7 days. 14 tablet Lurene ShadowPhelps, Cinde Ebert O, [place]-C     Controlled Substance Prescriptions  Controlled Substance Registry consulted? Not Applicable   Rolla Platehelps, Duey Liller O, PA-C 11/11/18 1733    Lurene ShadowPhelps, Srinika Delone O, PA-C 11/11/18 1734

## 2018-11-11 NOTE — Discharge Instructions (Signed)
°  Keep wound clean with warm water and mild soap. You may want to keep wound covered with a bandage to keep clean and protected given the location of the wound.  You may take 500mg  acetaminophen every 4-6 hours or in combination with ibuprofen 400-600mg  every 6-8 hours as needed for pain, inflammation, and fever.  Please follow up in 2-3 days if not improving, sooner if worsening.  Go to the emergency department if symptoms significantly worsening over night or this weekend.

## 2018-11-15 DIAGNOSIS — G4710 Hypersomnia, unspecified: Secondary | ICD-10-CM | POA: Diagnosis not present

## 2018-11-15 DIAGNOSIS — R0683 Snoring: Secondary | ICD-10-CM | POA: Diagnosis not present

## 2019-03-14 DIAGNOSIS — E890 Postprocedural hypothyroidism: Secondary | ICD-10-CM | POA: Diagnosis not present

## 2019-03-16 DIAGNOSIS — Z1389 Encounter for screening for other disorder: Secondary | ICD-10-CM | POA: Diagnosis not present

## 2019-03-16 DIAGNOSIS — Z0000 Encounter for general adult medical examination without abnormal findings: Secondary | ICD-10-CM | POA: Diagnosis not present

## 2019-03-16 DIAGNOSIS — Z6824 Body mass index (BMI) 24.0-24.9, adult: Secondary | ICD-10-CM | POA: Diagnosis not present

## 2019-03-16 DIAGNOSIS — N915 Oligomenorrhea, unspecified: Secondary | ICD-10-CM | POA: Diagnosis not present

## 2019-03-16 DIAGNOSIS — Z1231 Encounter for screening mammogram for malignant neoplasm of breast: Secondary | ICD-10-CM | POA: Diagnosis not present

## 2019-03-16 DIAGNOSIS — N898 Other specified noninflammatory disorders of vagina: Secondary | ICD-10-CM | POA: Diagnosis not present

## 2019-03-16 DIAGNOSIS — Z01419 Encounter for gynecological examination (general) (routine) without abnormal findings: Secondary | ICD-10-CM | POA: Diagnosis not present

## 2019-03-16 DIAGNOSIS — Z130 Encounter for screening for diseases of the blood and blood-forming organs and certain disorders involving the immune mechanism: Secondary | ICD-10-CM | POA: Diagnosis not present

## 2019-03-21 DIAGNOSIS — E890 Postprocedural hypothyroidism: Secondary | ICD-10-CM | POA: Diagnosis not present

## 2019-03-21 DIAGNOSIS — Z780 Asymptomatic menopausal state: Secondary | ICD-10-CM | POA: Diagnosis not present

## 2019-04-01 DIAGNOSIS — K641 Second degree hemorrhoids: Secondary | ICD-10-CM | POA: Diagnosis not present

## 2019-04-18 DIAGNOSIS — K641 Second degree hemorrhoids: Secondary | ICD-10-CM | POA: Diagnosis not present

## 2019-12-11 DIAGNOSIS — U071 COVID-19: Secondary | ICD-10-CM

## 2019-12-11 HISTORY — DX: COVID-19: U07.1

## 2020-03-20 ENCOUNTER — Other Ambulatory Visit: Payer: Self-pay | Admitting: Obstetrics and Gynecology

## 2020-03-20 DIAGNOSIS — N63 Unspecified lump in unspecified breast: Secondary | ICD-10-CM

## 2020-04-18 ENCOUNTER — Ambulatory Visit
Admission: RE | Admit: 2020-04-18 | Discharge: 2020-04-18 | Disposition: A | Discharge status: Home / Self Care | Payer: BC Managed Care – PPO | Source: Ambulatory Visit | Attending: Obstetrics and Gynecology | Admitting: Obstetrics and Gynecology

## 2020-04-18 ENCOUNTER — Other Ambulatory Visit: Payer: Self-pay

## 2020-04-18 DIAGNOSIS — N63 Unspecified lump in unspecified breast: Secondary | ICD-10-CM

## 2020-07-08 ENCOUNTER — Emergency Department: Admit: 2020-07-08 | Discharge status: Against Medical Advice | Payer: Self-pay

## 2020-07-08 ENCOUNTER — Emergency Department
Admission: EM | Admit: 2020-07-08 | Discharge: 2020-07-08 | Disposition: A | Discharge status: Home / Self Care | Payer: Managed Care, Other (non HMO) | Source: Home / Self Care | Attending: Family Medicine | Admitting: Family Medicine

## 2020-07-08 ENCOUNTER — Encounter: Payer: Self-pay | Admitting: Emergency Medicine

## 2020-07-08 ENCOUNTER — Other Ambulatory Visit: Payer: Self-pay

## 2020-07-08 DIAGNOSIS — N3091 Cystitis, unspecified with hematuria: Secondary | ICD-10-CM | POA: Diagnosis not present

## 2020-07-08 DIAGNOSIS — R319 Hematuria, unspecified: Secondary | ICD-10-CM

## 2020-07-08 LAB — POCT URINALYSIS DIP (MANUAL ENTRY)
Bilirubin, UA: NEGATIVE
Glucose, UA: NEGATIVE mg/dL
Ketones, POC UA: NEGATIVE mg/dL
Leukocytes, UA: NEGATIVE
Nitrite, UA: NEGATIVE
Protein Ur, POC: NEGATIVE mg/dL
Spec Grav, UA: 1.01 (ref 1.010–1.025)
Urobilinogen, UA: 0.2 E.U./dL
pH, UA: 7 (ref 5.0–8.0)

## 2020-07-08 MED ORDER — FLUCONAZOLE 150 MG PO TABS: 150.0000 mg | ORAL_TABLET | Freq: Every day | ORAL | 0 refills | Status: AC

## 2020-07-08 MED ORDER — NITROFURANTOIN MONOHYD MACRO 100 MG PO CAPS: 100.0000 mg | ORAL_CAPSULE | Freq: Two times a day (BID) | ORAL | 0 refills | Status: DC

## 2020-07-08 NOTE — ED Provider Notes (Signed)
[name]    CSN: [id number] Arrival date & time: 07/08/20  1404      History   Chief Complaint Chief Complaint  Patient presents with  . Back Pain    HPI Linda Drake is a 55 y.o. female.   HPI   Patient has had urinary frequency, hematuria, and some dull low back pain since yesterday.  No fever or chills.  No change in appetite.  No flank pain or severe pain.  No nausea.  No bowel complaints.  No vaginal complaints. Patient's only medication is thyroid. She states she has not been prone to bladder infections, her last one was 5 years ago  Past Medical History:  Diagnosis Date  . Thyroid disease    radioactive iodine    There are no problems to display for this patient.   Past Surgical History:  Procedure Laterality Date  . APPENDECTOMY    . CESAREAN SECTION      OB History   No obstetric history on file.      Home Medications    Prior to Admission medications   Medication Sig Start Date End Date Taking? Authorizing Provider  Ascorbic Acid (VITAMIN C PO) Take by mouth.   Yes [provider]  Cholecalciferol (VITAMIN D3 PO) Take by mouth.   Yes [provider]  fluconazole (DIFLUCAN) 150 MG tablet Take 1 tablet (150 mg total) by mouth daily. Repeat in 1 week if needed 07/08/20  Yes Eustace Moore, MD  levothyroxine (SYNTHROID, LEVOTHROID) 88 MCG tablet Take 88 mcg by mouth daily before breakfast.   Yes [provider]  nitrofurantoin, macrocrystal-monohydrate, (MACROBID) 100 MG capsule Take 1 capsule (100 mg total) by mouth 2 (two) times daily. Take with food. 07/08/20   Eustace Moore, MD    Family History Family History  Problem Relation Age of Onset  . Hypertension Mother     Social History Social History   Tobacco Use  . Smoking status: Former Smoker    Years: 15.00    Types: Cigarettes    Quit date: 06/22/2003    Years since quitting: 17.0  . Smokeless tobacco: Never Used  Substance Use Topics   . Alcohol use: Yes  . Drug use: No     Allergies   Morphine and related   Review of Systems Review of Systems  See HPI Physical Exam Triage Vital Signs ED Triage Vitals  Enc Vitals Group     BP 07/08/20 1430 128/81     Pulse Rate 07/08/20 1430 89     Resp --      Temp 07/08/20 1430 97.8 F (36.6 C)     Temp Source 07/08/20 1430 Oral     SpO2 07/08/20 1430 99 %     Weight --      Height --      Head Circumference --      Peak Flow --      Pain Score 07/08/20 1431 5     Pain Loc --      Pain Edu? --      Excl. in [place]? --    No data found.  Updated Vital Signs BP 128/81 (BP Location: Right Arm)   Pulse 89   Temp 97.8 F (36.6 C) (Oral)   LMP 11/07/2018   SpO2 99%     Physical Exam Constitutional:      General: She is not in acute distress.    Appearance: She is well-developed and well-nourished.  HENT:     Head: Normocephalic and atraumatic.     Nose:     Comments: Mask is in place    Mouth/Throat:     Mouth: Oropharynx is clear and moist.  Eyes:     Conjunctiva/sclera: Conjunctivae normal.     Pupils: Pupils are equal, round, and reactive to light.  Cardiovascular:     Rate and Rhythm: Normal rate.  Pulmonary:     Effort: Pulmonary effort is normal. No respiratory distress.  Abdominal:     General: There is no distension.     Palpations: Abdomen is soft.     Tenderness: There is no right CVA tenderness or left CVA tenderness.  Musculoskeletal:        General: No edema. Normal range of motion.     Cervical back: Normal range of motion.     Comments: No back or CVA tenderness.  Skin:    General: Skin is warm and dry.  Neurological:     Mental Status: She is alert and oriented to person, place, and time.  Psychiatric:        Behavior: Behavior normal.      UC Treatments / Results  Labs (all labs ordered are listed, but only abnormal results are displayed) Labs Reviewed  POCT URINALYSIS DIP (MANUAL ENTRY) - Abnormal; Notable for the  following components:      Result Value   Color, UA other (*)    Blood, UA large (*)    All other components within normal limits  URINE CULTURE    EKG   Radiology No results found.  Procedures Procedures (including critical care time)  Medications Ordered in UC Medications - No data to display  Initial Impression / Assessment and Plan / UC Course  I have reviewed the triage vital signs and the nursing notes.  Pertinent labs & imaging results that were available during my care of the patient were reviewed by me and considered in my medical decision making (see chart for details).     With hematuria and back pain I considered whether she might have a kidney stone versus hemorrhagic cystitis.  She does not have waves of colicky pain, or any kidney tenderness.  Will treat with antibiotics and culture the urine. Final Clinical Impressions(s) / UC Diagnoses   Final diagnoses:  Hematuria, unspecified type  Cystitis with hematuria     Discharge Instructions     Drink plenty of water Take antibiotic 2 times a day See your doctor if you fail to improve   ED Prescriptions    Medication Sig Dispense Auth. Provider   nitrofurantoin, macrocrystal-monohydrate, (MACROBID) 100 MG capsule  (Status: Discontinued) Take 1 capsule (100 mg total) by mouth 2 (two) times daily. Take with food. 14 capsule Eustace Moore, MD   nitrofurantoin, macrocrystal-monohydrate, (MACROBID) 100 MG capsule Take 1 capsule (100 mg total) by mouth 2 (two) times daily. Take with food. 14 capsule Eustace Moore, MD   fluconazole (DIFLUCAN) 150 MG tablet Take 1 tablet (150 mg total) by mouth daily. Repeat in 1 week if needed 2 tablet Eustace Moore, MD     PDMP not reviewed this encounter.   Eustace Moore, MD 07/08/20 [phone]

## 2020-07-08 NOTE — ED Triage Notes (Signed)
Patient c/o low back pain x 1 day, no apparent injury.  Patient is also having hematuria, no dysuria, urgency and frequency.  Patient has not tried any OTC meds.

## 2020-07-08 NOTE — Discharge Instructions (Addendum)
Drink plenty of water Take antibiotic 2 times a day See your doctor if you fail to improve

## 2020-07-10 LAB — URINE CULTURE
MICRO NUMBER:: 11585525
Result:: NO GROWTH
SPECIMEN QUALITY:: ADEQUATE

## 2020-10-10 DIAGNOSIS — N200 Calculus of kidney: Secondary | ICD-10-CM

## 2020-10-10 HISTORY — DX: Calculus of kidney: N20.0

## 2020-12-06 HISTORY — PX: UPPER GI ENDOSCOPY: SHX6162

## 2020-12-14 ENCOUNTER — Other Ambulatory Visit: Payer: Self-pay

## 2020-12-14 ENCOUNTER — Encounter: Payer: Self-pay | Admitting: Emergency Medicine

## 2020-12-14 ENCOUNTER — Emergency Department
Admission: EM | Admit: 2020-12-14 | Discharge: 2020-12-14 | Disposition: A | Discharge status: Home / Self Care | Payer: Managed Care, Other (non HMO) | Source: Home / Self Care | Attending: Family Medicine | Admitting: Family Medicine

## 2020-12-14 DIAGNOSIS — R21 Rash and other nonspecific skin eruption: Secondary | ICD-10-CM | POA: Diagnosis not present

## 2020-12-14 HISTORY — DX: Esophageal obstruction: K22.2

## 2020-12-14 LAB — CBC WITH DIFFERENTIAL/PLATELET
Absolute Monocytes: 406 cells/uL (ref 200–950)
Basophils Absolute: 62 cells/uL (ref 0–200)
Basophils Relative: 2 %
Eosinophils Absolute: 112 cells/uL (ref 15–500)
Eosinophils Relative: 3.6 %
HCT: 48.1 % — ABNORMAL HIGH (ref 35.0–45.0)
Hemoglobin: 16.2 g/dL — ABNORMAL HIGH (ref 11.7–15.5)
Lymphs Abs: 651 cells/uL — ABNORMAL LOW (ref 850–3900)
MCH: 30.1 pg (ref 27.0–33.0)
MCHC: 33.7 g/dL (ref 32.0–36.0)
MCV: 89.4 fL (ref 80.0–100.0)
MPV: 10.9 fL (ref 7.5–12.5)
Monocytes Relative: 13.1 %
Neutro Abs: 1869 cells/uL (ref 1500–7800)
Neutrophils Relative %: 60.3 %
Platelets: 245 10*3/uL (ref 140–400)
RBC: 5.38 10*6/uL — ABNORMAL HIGH (ref 3.80–5.10)
RDW: 12.2 % (ref 11.0–15.0)
Total Lymphocyte: 21 %
WBC: 3.1 10*3/uL — ABNORMAL LOW (ref 3.8–10.8)

## 2020-12-14 MED ORDER — PREDNISONE 20 MG PO TABS: ORAL_TABLET | ORAL | 0 refills | Status: DC

## 2020-12-14 NOTE — Discharge Instructions (Signed)
May take a non-sedating antihistamine such as Zyrtec as needed for itching.

## 2020-12-14 NOTE — ED Triage Notes (Signed)
Rash to forearms & thighs  Benadryl & ibuprofen  this am  Cream (prescription this past week) Endo last week -to stretch her esophagus  COVID 8/22 No COVID vaccine

## 2020-12-15 NOTE — Progress Notes (Signed)
Patient called. VM left x 1 - results reviewed by Dr Cathren Harsh who believes the labs are due to a virus, but would like you to to continue to rest, avoid the sun, stay hydrated & follow up with your PCP to see if labs should be rechecked upon your return to home.

## 2020-12-16 NOTE — ED Provider Notes (Signed)
[name]    CSN: [id number] Arrival date & time: 12/14/20  0909      History   Chief Complaint Chief Complaint  Patient presents with   Rash   Headache    HPI Linda Drake is a 55 y.o. female.   Four days ago patient developed several small pruritic bumps on her arms.  She has felt fatigued and noticed some soreness in her neck bilaterally.  She later developed more diffuse pruritic rash on her bilateral thighs.  She denies sores in her mouth  The history is provided by the patient.  Rash Location: arms and legs. Quality: dryness, itchiness, redness and swelling   Quality: not blistering, not bruising, not burning, not draining, not painful, not peeling, not scaling and not weeping   Severity:  Mild Onset quality:  Sudden Duration:  4 days Timing:  Constant Progression:  Worsening Chronicity:  New Context: not animal contact, not chemical exposure, not eggs, not exposure to similar rash, not food, not hot tub use, not insect bite/sting, not medications, not new detergent/soap, not nuts, not plant contact, not sick contacts and not sun exposure   Relieved by:  Nothing Worsened by:  Nothing Ineffective treatments:  Anti-itch cream Associated symptoms: fatigue   Associated symptoms: no fever, no hoarse voice, no induration, no periorbital edema, no shortness of breath, no throat swelling, no tongue swelling, no URI and not wheezing    Past Medical History:  Diagnosis Date   COVID-19 12/2019   Esophageal stricture    Kidney stone 10/2020   Thyroid disease    radioactive iodine    There are no problems to display for this patient.   Past Surgical History:  Procedure Laterality Date   APPENDECTOMY     CESAREAN SECTION     LITHOTRIPSY     UPPER GI ENDOSCOPY  12/06/2020   stricture    OB History   No obstetric history on file.      Home Medications    Prior to Admission medications   Medication Sig Start Date End Date Taking? Authorizing  Provider  predniSONE (DELTASONE) 20 MG tablet Take one tab by mouth twice daily for 4 days, then one daily for 3 days. Take with food. 12/14/20  Yes Lattie Haw, MD  Ascorbic Acid (VITAMIN C PO) Take by mouth. Patient not taking: Reported on 12/14/2020    [provider]  Cholecalciferol (VITAMIN D3 PO) Take by mouth.    [provider]  cyanocobalamin 100 MCG tablet Vitamin B12    [provider]  estradiol (VIVELLE-DOT) 0.05 MG/24HR patch estradiol 0.05 mg/24 hr semiweekly transdermal patch  APPLY TRANSDERMALLY TWICE A WEEK    [provider]  fluconazole (DIFLUCAN) 150 MG tablet Take 1 tablet (150 mg total) by mouth daily. Repeat in 1 week if needed Patient not taking: Reported on 12/14/2020 07/08/20   Eustace Moore, MD  levothyroxine (SYNTHROID, LEVOTHROID) 88 MCG tablet Take 88 mcg by mouth daily before breakfast.    [provider]  nitrofurantoin, macrocrystal-monohydrate, (MACROBID) 100 MG capsule Take 1 capsule (100 mg total) by mouth 2 (two) times daily. Take with food. Patient not taking: Reported on 12/14/2020 07/08/20   Eustace Moore, MD  progesterone (PROMETRIUM) 100 MG capsule Take by mouth.    [provider]    Family History Family History  Problem Relation Age of Onset   Hypertension Mother    Dementia Father    Parkinson's disease Father  Social History Social History   Tobacco Use   Smoking status: Former    Years: 15.00    Types: Cigarettes    Quit date: 06/22/2003    Years since quitting: 17.4   Smokeless tobacco: Never  Substance Use Topics   Alcohol use: Yes   Drug use: No     Allergies   Morphine and related   Review of Systems Review of Systems  Constitutional:  Positive for fatigue. Negative for activity change, appetite change, chills, diaphoresis and fever.  HENT:  Negative for hoarse voice.   Respiratory:  Negative for shortness of breath and wheezing.   Skin:  Positive for  rash.  All other systems reviewed and are negative.   Physical Exam Triage Vital Signs ED Triage Vitals  Enc Vitals Group     BP 12/14/20 0935 132/89     Pulse Rate 12/14/20 0935 79     Resp 12/14/20 0935 16     Temp 12/14/20 0935 98.9 F (37.2 C)     Temp Source 12/14/20 0935 Oral     SpO2 12/14/20 0935 99 %     Weight 12/14/20 0937 149 lb (67.6 kg)     Height 12/14/20 0937 5\' 6"  (1.676 m)     Head Circumference --      Peak Flow --      Pain Score 12/14/20 0937 0     Pain Loc --      Pain Edu? --      Excl. in [place]? --    No data found.  Updated Vital Signs BP 132/89 (BP Location: Right Arm)   Pulse 79   Temp 98.9 F (37.2 C) (Oral)   Resp 16   Ht 5\' 6"  (1.676 m)   Wt 67.6 kg   LMP 11/07/2018   SpO2 99%   BMI 24.05 kg/m   Visual Acuity Right Eye Distance:   Left Eye Distance:   Bilateral Distance:    Right Eye Near:   Left Eye Near:    Bilateral Near:     Physical Exam Vitals and nursing note reviewed.  Constitutional:      General: She is not in acute distress. HENT:     Head: Normocephalic.     Nose: Nose normal.     Mouth/Throat:     Pharynx: Oropharynx is clear.  Eyes:     Conjunctiva/sclera: Conjunctivae normal.     Pupils: Pupils are equal, round, and reactive to light.  Cardiovascular:     Rate and Rhythm: Normal rate and regular rhythm.     Heart sounds: Normal heart sounds.  Pulmonary:     Breath sounds: Normal breath sounds.  Abdominal:     Palpations: Abdomen is soft.     Tenderness: There is no abdominal tenderness.  Musculoskeletal:     Cervical back: Neck supple.     Right lower leg: No edema.     Left lower leg: No edema.  Lymphadenopathy:     Cervical: Cervical adenopathy present.  Skin:    General: Skin is warm and dry.     Findings: Rash present.          Comments: Macular erythema on anterior/lateral thighs.  Forearms have several scattered non-specific punctate erythematous papules.  Neurological:     Mental Status:  She is alert and oriented to person, place, and time.     UC Treatments / Results  Labs (all labs ordered are listed, but only abnormal results are displayed) Labs  Reviewed  CBC WITH DIFFERENTIAL/PLATELET - Abnormal; Notable for the following components:      Result Value   WBC 3.1 (*)    RBC 5.38 (*)    Hemoglobin 16.2 (*)    HCT 48.1 (*)    Lymphs Abs 651 (*)    All other components within normal limits    EKG   Radiology No results found.  Procedures Procedures (including critical care time)  Medications Ordered in UC Medications - No data to display  Initial Impression / Assessment and Plan / UC Course  I have reviewed the triage vital signs and the nursing notes.  Pertinent labs & imaging results that were available during my care of the patient were reviewed by me and considered in my medical decision making (see chart for details).    Suspect viral exanthem.  Check CBC. Begin prednisone burst/taper for symptomatic improvement. Followup with Family Doctor if not improved in about 7 to 10 days.  Final Clinical Impressions(s) / UC Diagnoses   Final diagnoses:  Rash and nonspecific skin eruption     Discharge Instructions      May take a non-sedating antihistamine such as Zyrtec as needed for itching.   ED Prescriptions     Medication Sig Dispense Auth. Provider   predniSONE (DELTASONE) 20 MG tablet Take one tab by mouth twice daily for 4 days, then one daily for 3 days. Take with food. 11 tablet Lattie Haw, MD         Lattie Haw, MD 12/16/20 [phone]

## 2021-04-02 ENCOUNTER — Encounter: Payer: Self-pay | Admitting: Emergency Medicine

## 2021-04-02 ENCOUNTER — Emergency Department (INDEPENDENT_AMBULATORY_CARE_PROVIDER_SITE_OTHER)
Admission: EM | Admit: 2021-04-02 | Discharge: 2021-04-02 | Disposition: A | Discharge status: Home / Self Care | Payer: Managed Care, Other (non HMO) | Source: Home / Self Care | Attending: Family Medicine | Admitting: Family Medicine

## 2021-04-02 ENCOUNTER — Other Ambulatory Visit: Payer: Self-pay

## 2021-04-02 DIAGNOSIS — S61211A Laceration without foreign body of left index finger without damage to nail, initial encounter: Secondary | ICD-10-CM

## 2021-04-02 NOTE — ED Provider Notes (Signed)
[facility]    CSN: [id number] Arrival date & time: 04/02/21  1733      History   Chief Complaint Chief Complaint  Patient presents with   Finger Injury    HPI Linda Drake is a 55 y.o. female.   HPI Patient lacerated finger with a hedge clipper today.  Happened about 1 hour ago.  She is here for repair Past Medical History:  Diagnosis Date   COVID-19 12/2019   Esophageal stricture    Kidney stone 10/2020   Thyroid disease    radioactive iodine    There are no problems to display for this patient.   Past Surgical History:  Procedure Laterality Date   APPENDECTOMY     CESAREAN SECTION     LITHOTRIPSY     UPPER GI ENDOSCOPY  12/06/2020   stricture    OB History   No obstetric history on file.      Home Medications    Prior to Admission medications   Medication Sig Start Date End Date Taking? Authorizing Provider  Menaquinone-7 (K2-45) 45 MCG CAPS Take by mouth.   Yes [provider]  Ascorbic Acid (VITAMIN C PO) Take by mouth. Patient not taking: Reported on 12/14/2020    [provider]  Cholecalciferol (VITAMIN D3 PO) Take by mouth.    [provider]  estradiol (VIVELLE-DOT) 0.05 MG/24HR patch estradiol 0.05 mg/24 hr semiweekly transdermal patch  APPLY TRANSDERMALLY TWICE A WEEK    [provider]  fluconazole (DIFLUCAN) 150 MG tablet Take 1 tablet (150 mg total) by mouth daily. Repeat in 1 week if needed Patient not taking: Reported on 12/14/2020 07/08/20   Linda Moore, MD  levothyroxine (SYNTHROID, LEVOTHROID) 88 MCG tablet Take 88 mcg by mouth daily before breakfast.    [provider]  progesterone (PROMETRIUM) 100 MG capsule Take by mouth.    [provider]    Family History Family History  Problem Relation Age of Onset   Hypertension Mother    Dementia Father    Parkinson's disease Father     Social History Social History   Tobacco Use   Smoking status: Former     Years: 15.00    Types: Cigarettes    Quit date: 06/22/2003    Years since quitting: 17.7   Smokeless tobacco: Never  Vaping Use   Vaping Use: Never used  Substance Use Topics   Alcohol use: Yes   Drug use: No     Allergies   Morphine and related   Review of Systems Review of Systems See HPI  Physical Exam Triage Vital Signs ED Triage Vitals  Enc Vitals Group     BP 04/02/21 1813 (!) 155/97     Pulse Rate 04/02/21 1813 98     Resp 04/02/21 1813 18     Temp 04/02/21 1813 99 F (37.2 C)     Temp Source 04/02/21 1813 Oral     SpO2 --      Weight 04/02/21 1814 145 lb (65.8 kg)     Height 04/02/21 1814 5\' 6"  (1.676 m)     Head Circumference --      Peak Flow --      Pain Score 04/02/21 1813 1     Pain Loc --      Pain Edu? --      Excl. in GC? --    No data found.  Updated Vital Signs BP (!) 155/97 (BP Location: Right Arm)  Pulse 98   Temp 99 F (37.2 C) (Oral)   Resp 18   Ht 5\' 6"  (1.676 m)   Wt 65.8 kg   LMP 11/07/2018   BMI 23.40 kg/m   :     Physical Exam Constitutional:      General: She is not in acute distress.    Appearance: She is well-developed.  HENT:     Head: Normocephalic and atraumatic.  Eyes:     Conjunctiva/sclera: Conjunctivae normal.     Pupils: Pupils are equal, round, and reactive to light.  Cardiovascular:     Rate and Rhythm: Normal rate.  Pulmonary:     Effort: Pulmonary effort is normal. No respiratory distress.  Abdominal:     General: There is no distension.     Palpations: Abdomen is soft.  Musculoskeletal:        General: Normal range of motion.     Cervical back: Normal range of motion.  Skin:    General: Skin is warm and dry.     Comments: 2 cm laceration, V-shaped, on the palmar aspect of the index finger overlying the proximal phalanx  Neurological:     Mental Status: She is alert.     UC Treatments / Results  Labs (all labs ordered are listed, but only abnormal results are displayed) Labs Reviewed - No  data to display  EKG   Radiology No results found.  Procedures Laceration Repair  Date/Time: 04/02/2021 9:05 PM Performed by: 04/04/2021, MD Authorized by: Linda Moore, MD   Consent:    Consent obtained:  Verbal   Consent given by:  Patient   Risks discussed:  Infection Universal protocol:    Patient identity confirmed:  Verbally with patient and arm band Anesthesia:    Anesthesia method:  Local infiltration   Local anesthetic:  Lidocaine 1% w/o epi Laceration details:    Location:  Finger   Finger location:  L index finger   Length (cm):  2   Depth (mm):  10 Pre-procedure details:    Preparation:  Patient was prepped and draped in usual sterile fashion Exploration:    Hemostasis achieved with:  Direct pressure   Wound extent: no nerve damage noted and no tendon damage noted   Treatment:    Area cleansed with:  Chlorhexidine   Amount of cleaning:  Standard   Debridement:  Minimal   Undermining:  None Skin repair:    Repair method:  Sutures   Suture size:  4-0   Suture material:  Nylon   Number of sutures:  3 Approximation:    Approximation:  Close Repair type:    Repair type:  Simple Post-procedure details:    Dressing:  Antibiotic ointment and non-adherent dressing   Procedure completion:  Tolerated (including critical care time)  Medications Ordered in UC Medications - No data to display  Initial Impression / Assessment and Plan / UC Course  I have reviewed the triage vital signs and the nursing notes.  Pertinent labs & imaging results that were available during my care of the patient were reviewed by me and considered in my medical decision making (see chart for details).     Wound care discussed Final Clinical Impressions(s) / UC Diagnoses   Final diagnoses:  Laceration of left index finger without foreign body without damage to nail, initial encounter     Discharge Instructions      Keep clean and dry Watch for  infection Stitches need to be  removed at 7-10 days   ED Prescriptions   None    PDMP not reviewed this encounter.   Linda Moore, MD 04/02/21 2107

## 2021-04-02 NOTE — Discharge Instructions (Signed)
Keep clean and dry Watch for infection Stitches need to be removed at 7-10 days

## 2021-04-02 NOTE — ED Triage Notes (Signed)
Left second finger cut with hedge trimmer about one hour ago.

## 2022-10-23 IMAGING — MG MM DIGITAL DIAGNOSTIC UNILAT*L* W/ TOMO W/ CAD
4 series · 4 of 12 positions shown · non-contrast
Comparison: Previous exam(s).

CLINICAL DATA: Screening recall for a possible left breast mass.
The patient was recently put on hormone replacement therapy.

EXAM:
DIGITAL DIAGNOSTIC UNILATERAL LEFT MAMMOGRAM WITH TOMO AND CAD;
ULTRASOUND LEFT BREAST LIMITED

[L ML synth-2D]
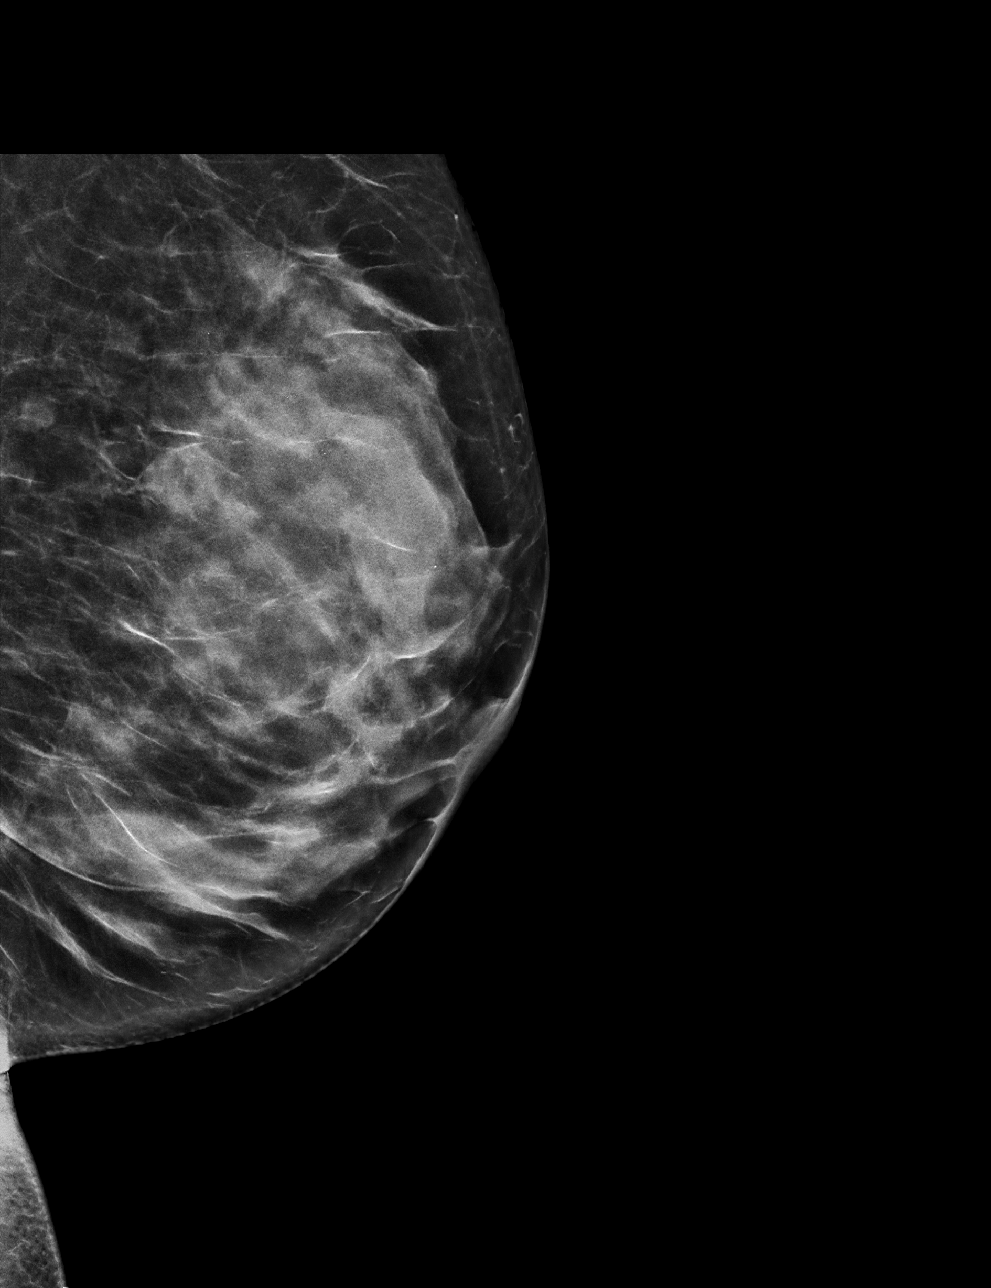

[L MLO synth-2D]
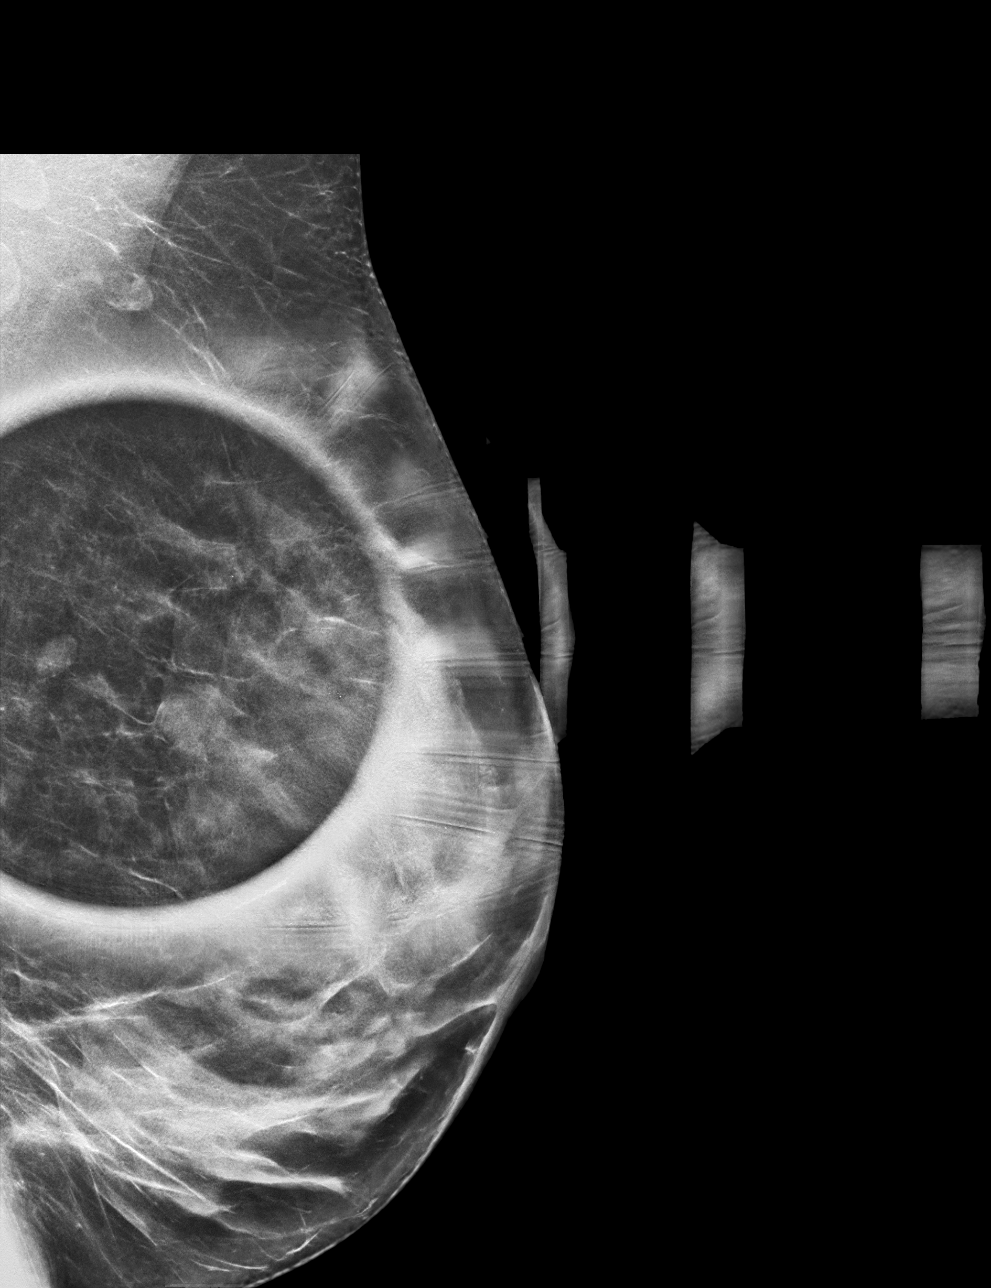

[L ML tomo · tomo slice 32/63.0]
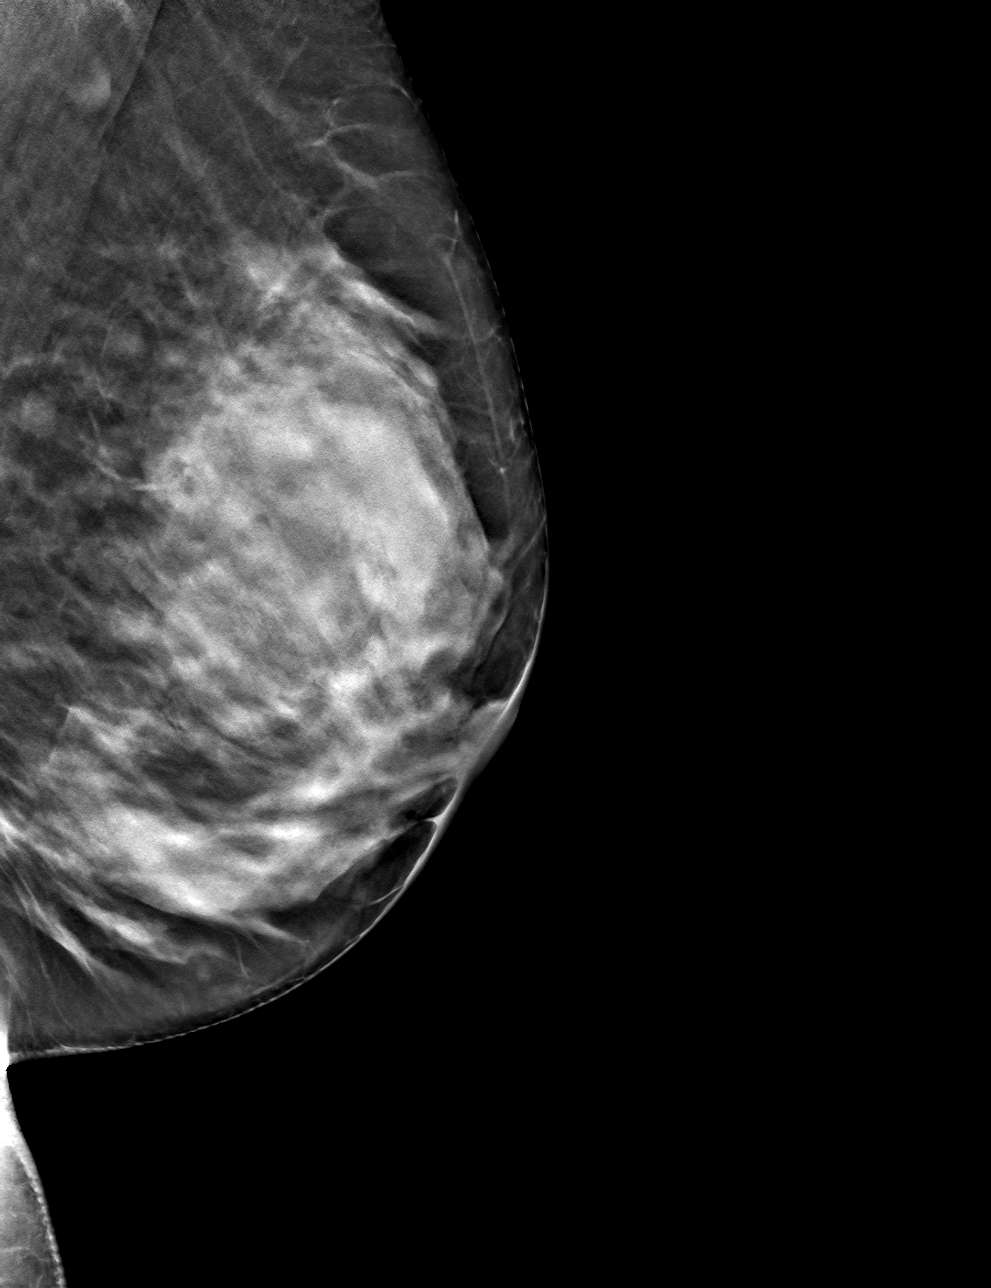

[L MLO tomo · tomo slice 30/59.0]
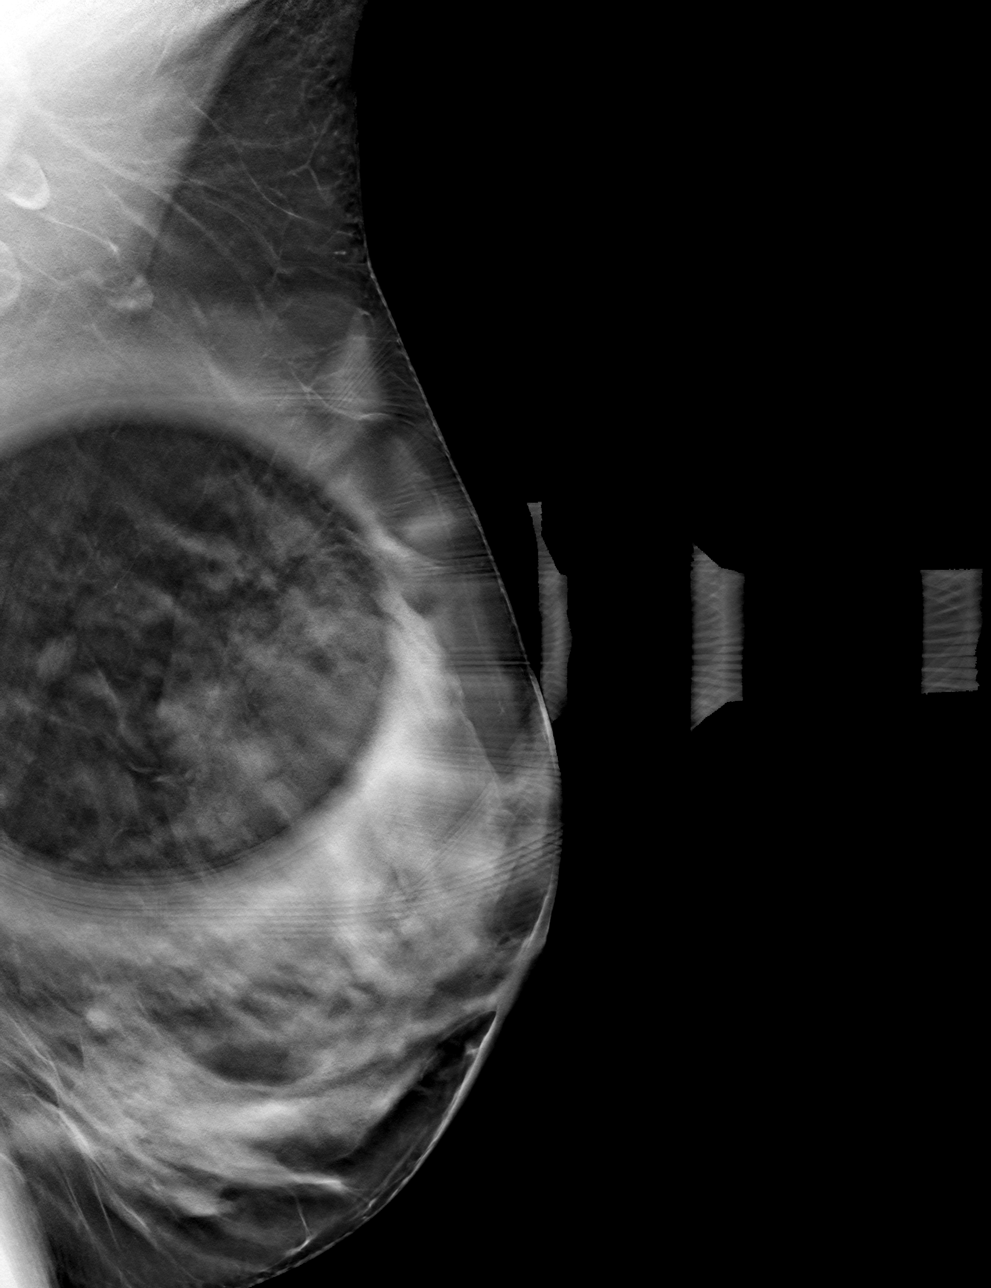

[4 of 12 positions shown; findings below may reference images not displayed]

ACR Breast Density Category c: The breast tissue is heterogeneously
dense, which may obscure small masses.
FINDINGS: Spot compression tomosynthesis images through the lateral far
posterior left breast demonstrates a persistent obscured oval mass
measuring 1.3 cm. There is a smaller oval mass just posterior to
this measuring 8 mm which has been seen on prior mammograms (as an
example 04/16/2015), most likely representing a lymph node.

Mammographic images were processed with CAD.

Ultrasound targeted to the left breast at 2 o'clock, 5 cm from the
nipple demonstrates an anechoic oval circumscribed mass measuring
1.1 x 0.7 x 1.0 cm.
IMPRESSION: The mass in the left breast at 2 o'clock corresponds with a benign
cyst.

RECOMMENDATION:
Screening mammogram in one year.(Code:MJ-X-DII)

I have discussed the findings and recommendations with the patient.
If applicable, a reminder letter will be sent to the patient
regarding the next appointment.

BI-RADS CATEGORY  2: Benign.

## 2022-10-23 IMAGING — US US BREAST*L* LIMITED INC AXILLA
1 series · 5 of 5 positions shown · non-contrast
Comparison: Previous exam(s).

CLINICAL DATA: Screening recall for a possible left breast mass.
The patient was recently put on hormone replacement therapy.

EXAM:
DIGITAL DIAGNOSTIC UNILATERAL LEFT MAMMOGRAM WITH TOMO AND CAD;
ULTRASOUND LEFT BREAST LIMITED

[Series 1: us breast*left* limited inc axilla · 0.06mm/px · 5 of 5 slices shown]
[im 1/5]
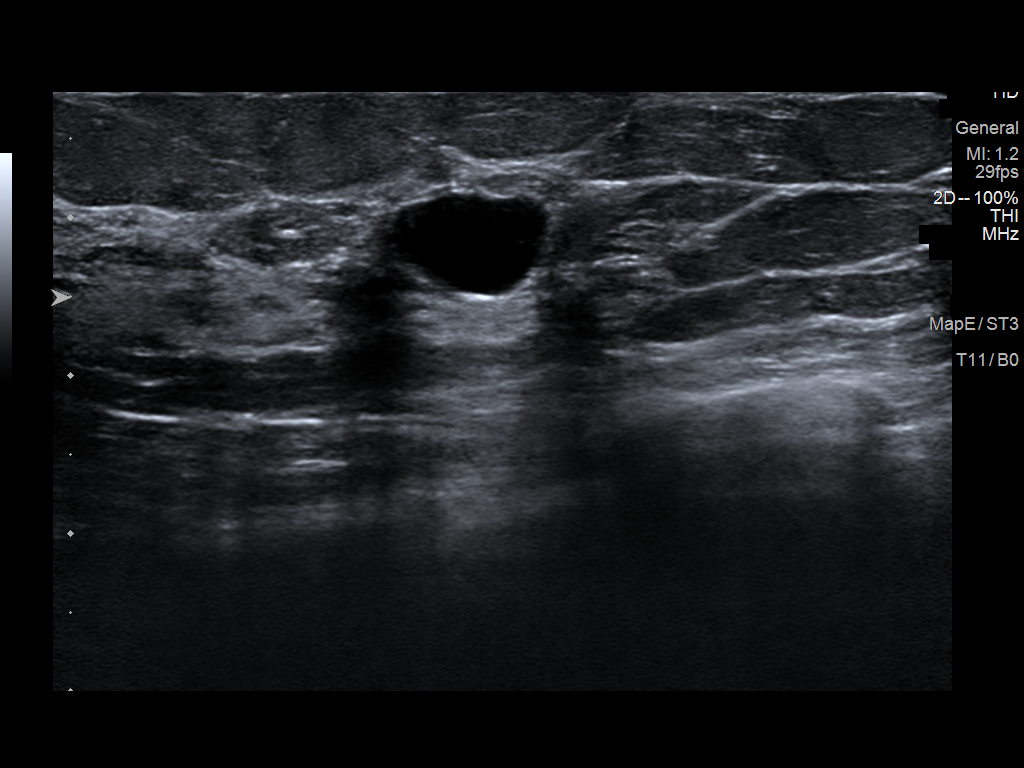
[im 2/5]
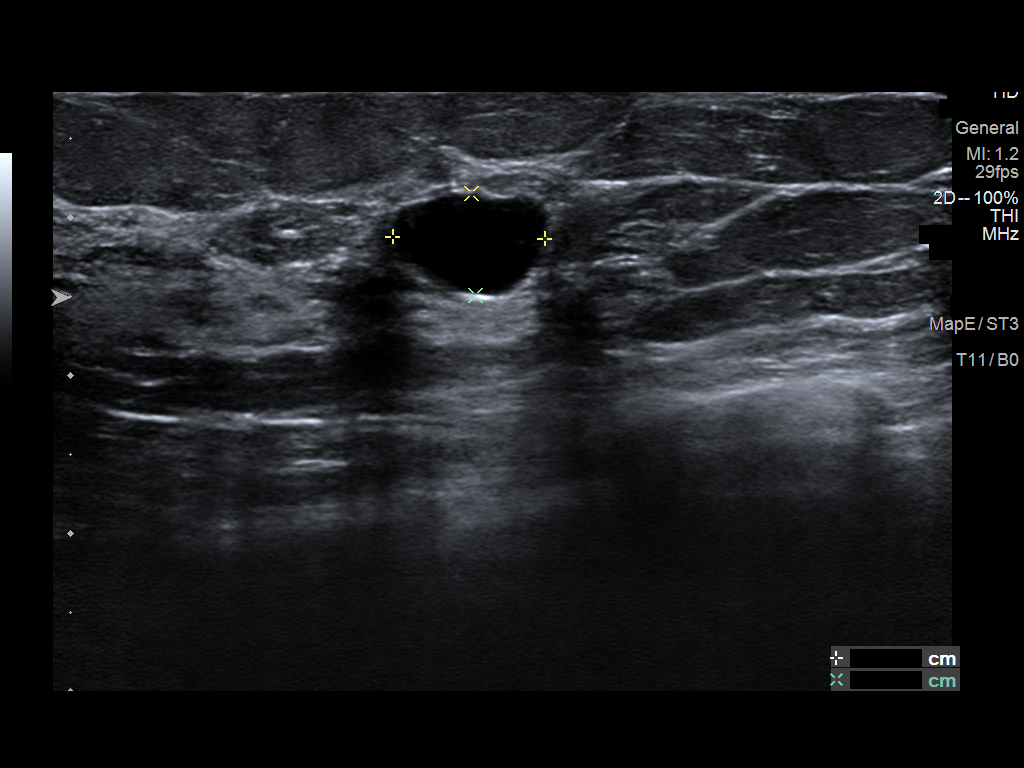
[im 3/5]
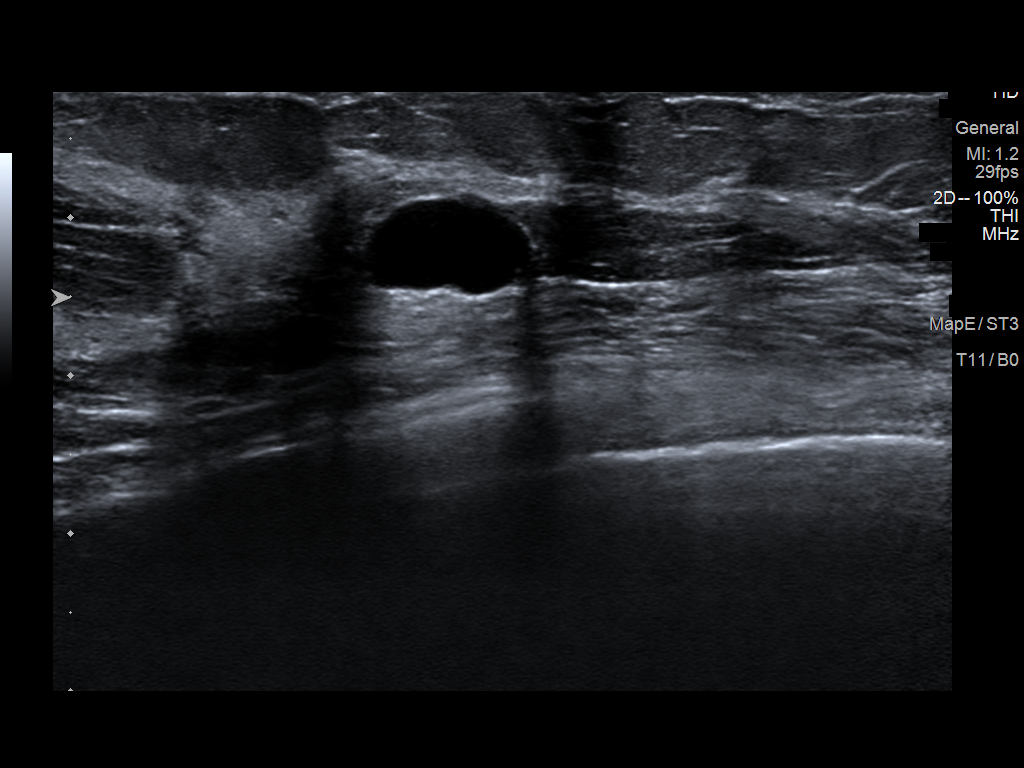
[im 4/5]
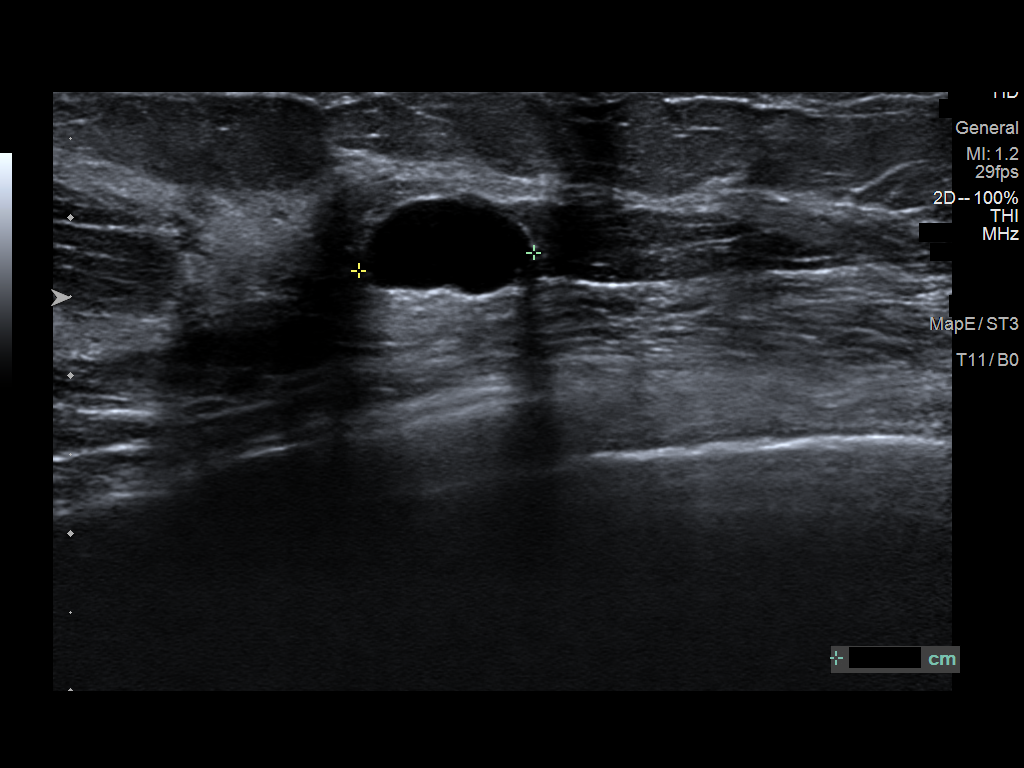
[im 5/5]
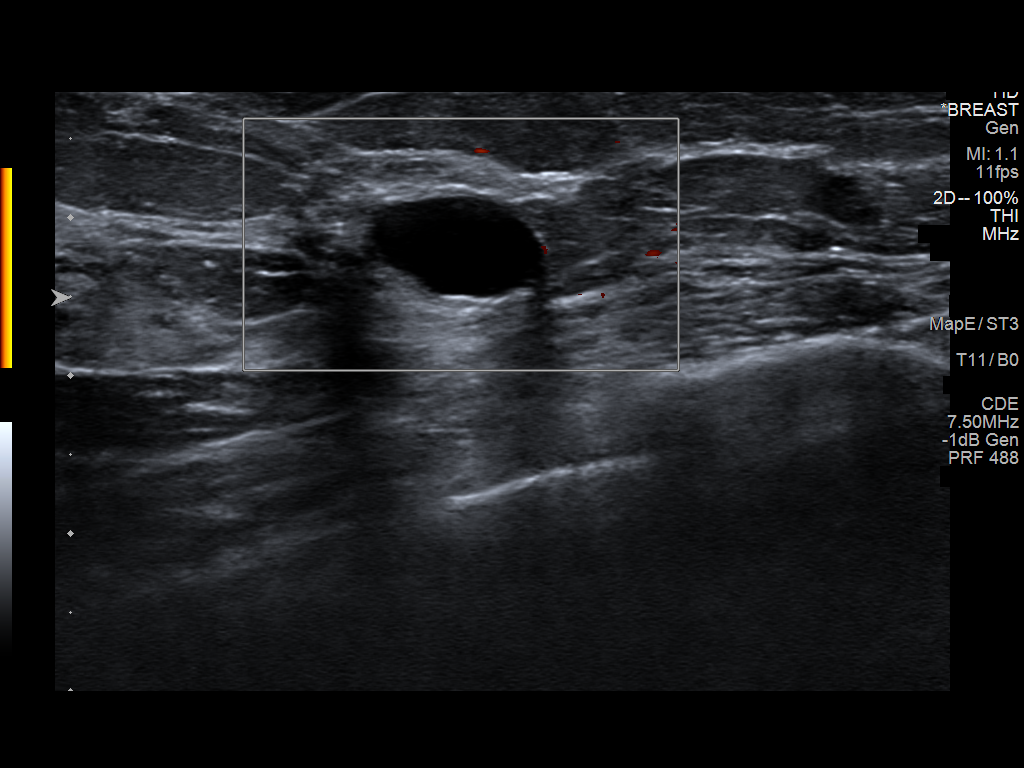

[5 of 5 positions shown; findings below may reference images not displayed]

ACR Breast Density Category c: The breast tissue is heterogeneously
dense, which may obscure small masses.
FINDINGS: Spot compression tomosynthesis images through the lateral far
posterior left breast demonstrates a persistent obscured oval mass
measuring 1.3 cm. There is a smaller oval mass just posterior to
this measuring 8 mm which has been seen on prior mammograms (as an
example 04/16/2015), most likely representing a lymph node.

Mammographic images were processed with CAD.

Ultrasound targeted to the left breast at 2 o'clock, 5 cm from the
nipple demonstrates an anechoic oval circumscribed mass measuring
1.1 x 0.7 x 1.0 cm.
IMPRESSION: The mass in the left breast at 2 o'clock corresponds with a benign
cyst.

RECOMMENDATION:
Screening mammogram in one year.(Code:MJ-X-DII)

I have discussed the findings and recommendations with the patient.
If applicable, a reminder letter will be sent to the patient
regarding the next appointment.

BI-RADS CATEGORY  2: Benign.
